# Patient Record
Sex: Male | Born: 1970 | Race: White | Hispanic: No | Smoking: Former smoker
Health system: Southern US, Community
[De-identification: ages and names within clinical notes are randomized; demographics above are authoritative.]

## PROBLEM LIST (undated history)

## (undated) DIAGNOSIS — K219 Gastro-esophageal reflux disease without esophagitis: Secondary | ICD-10-CM

## (undated) DIAGNOSIS — C6292 Malignant neoplasm of left testis, unspecified whether descended or undescended: Secondary | ICD-10-CM

## (undated) DIAGNOSIS — F419 Anxiety disorder, unspecified: Secondary | ICD-10-CM

## (undated) DIAGNOSIS — Z973 Presence of spectacles and contact lenses: Secondary | ICD-10-CM

## (undated) DIAGNOSIS — H9313 Tinnitus, bilateral: Secondary | ICD-10-CM

## (undated) DIAGNOSIS — Z87442 Personal history of urinary calculi: Secondary | ICD-10-CM

## (undated) HISTORY — DX: Malignant neoplasm of left testis, unspecified whether descended or undescended: C62.92

---

## 1999-12-31 ENCOUNTER — Encounter: Payer: Self-pay | Admitting: Emergency Medicine

## 1999-12-31 ENCOUNTER — Emergency Department (HOSPITAL_COMMUNITY): Admission: EM | Admit: 1999-12-31 | Discharge: 2000-01-01 | Payer: Self-pay | Admitting: Emergency Medicine

## 2014-12-06 DIAGNOSIS — Z8547 Personal history of malignant neoplasm of testis: Secondary | ICD-10-CM

## 2014-12-06 DIAGNOSIS — E291 Testicular hypofunction: Secondary | ICD-10-CM

## 2014-12-06 HISTORY — DX: Testicular hypofunction: E29.1

## 2014-12-06 HISTORY — DX: Personal history of malignant neoplasm of testis: Z85.47

## 2014-12-25 ENCOUNTER — Other Ambulatory Visit: Payer: Self-pay | Admitting: Urology

## 2015-01-01 ENCOUNTER — Encounter (HOSPITAL_BASED_OUTPATIENT_CLINIC_OR_DEPARTMENT_OTHER): Payer: Self-pay | Admitting: *Deleted

## 2015-01-01 NOTE — Progress Notes (Signed)
NPO AFTER MN.  ARRIVE AT 0700.  NEEDS HG.  

## 2015-01-02 ENCOUNTER — Encounter (HOSPITAL_BASED_OUTPATIENT_CLINIC_OR_DEPARTMENT_OTHER): Payer: Self-pay | Admitting: Anesthesiology

## 2015-01-02 NOTE — Anesthesia Preprocedure Evaluation (Addendum)
Anesthesia Evaluation  Patient identified by MRN, date of birth, ID band Patient awake    Reviewed: Allergy & Precautions, NPO status , Patient's Chart, lab work & pertinent test results  Airway Mallampati: II  TM Distance: >3 FB Neck ROM: Full    Dental no notable dental hx. (+) Dental Advisory Given,    Pulmonary Current Smoker,  breath sounds clear to auscultation  Pulmonary exam normal       Cardiovascular negative cardio ROS  Rhythm:Regular Rate:Normal     Neuro/Psych Anxiety Tinnitus    GI/Hepatic Neg liver ROS, GERD-  Medicated,  Endo/Other  negative endocrine ROS  Renal/GU negative Renal ROS  negative genitourinary   Musculoskeletal negative musculoskeletal ROS (+)   Abdominal   Peds negative pediatric ROS (+)  Hematology negative hematology ROS (+)   Anesthesia Other Findings   Reproductive/Obstetrics negative OB ROS                            Anesthesia Physical Anesthesia Plan  ASA: II  Anesthesia Plan: General   Post-op Pain Management:    Induction: Intravenous  Airway Management Planned: LMA  Additional Equipment:   Intra-op Plan:   Post-operative Plan: Extubation in OR  Informed Consent: I have reviewed the patients History and Physical, chart, labs and discussed the procedure including the risks, benefits and alternatives for the proposed anesthesia with the patient or authorized representative who has indicated his/her understanding and acceptance.   Dental advisory given  Plan Discussed with: CRNA  Anesthesia Plan Comments:         Anesthesia Quick Evaluation

## 2015-01-03 ENCOUNTER — Ambulatory Visit (HOSPITAL_BASED_OUTPATIENT_CLINIC_OR_DEPARTMENT_OTHER): Payer: BLUE CROSS/BLUE SHIELD | Admitting: Anesthesiology

## 2015-01-03 ENCOUNTER — Encounter (HOSPITAL_BASED_OUTPATIENT_CLINIC_OR_DEPARTMENT_OTHER)
Admission: RE | Disposition: A | Discharge status: Home / Self Care | Payer: Self-pay | Source: Ambulatory Visit | Attending: Urology

## 2015-01-03 ENCOUNTER — Encounter (HOSPITAL_BASED_OUTPATIENT_CLINIC_OR_DEPARTMENT_OTHER): Payer: Self-pay | Admitting: *Deleted

## 2015-01-03 ENCOUNTER — Ambulatory Visit (HOSPITAL_BASED_OUTPATIENT_CLINIC_OR_DEPARTMENT_OTHER)
Admission: RE | Admit: 2015-01-03 | Discharge: 2015-01-03 | Disposition: A | Discharge status: Home / Self Care | Payer: BLUE CROSS/BLUE SHIELD | Source: Ambulatory Visit | Attending: Urology | Admitting: Urology

## 2015-01-03 ENCOUNTER — Ambulatory Visit (HOSPITAL_COMMUNITY): Payer: BLUE CROSS/BLUE SHIELD

## 2015-01-03 DIAGNOSIS — Z888 Allergy status to other drugs, medicaments and biological substances status: Secondary | ICD-10-CM | POA: Diagnosis not present

## 2015-01-03 DIAGNOSIS — F419 Anxiety disorder, unspecified: Secondary | ICD-10-CM | POA: Diagnosis not present

## 2015-01-03 DIAGNOSIS — Z01818 Encounter for other preprocedural examination: Secondary | ICD-10-CM

## 2015-01-03 DIAGNOSIS — N500 Atrophy of testis: Secondary | ICD-10-CM | POA: Insufficient documentation

## 2015-01-03 DIAGNOSIS — L720 Epidermal cyst: Secondary | ICD-10-CM | POA: Diagnosis not present

## 2015-01-03 DIAGNOSIS — I861 Scrotal varices: Secondary | ICD-10-CM | POA: Diagnosis not present

## 2015-01-03 DIAGNOSIS — N508 Other specified disorders of male genital organs: Secondary | ICD-10-CM | POA: Insufficient documentation

## 2015-01-03 DIAGNOSIS — F17210 Nicotine dependence, cigarettes, uncomplicated: Secondary | ICD-10-CM | POA: Diagnosis not present

## 2015-01-03 DIAGNOSIS — N411 Chronic prostatitis: Secondary | ICD-10-CM | POA: Insufficient documentation

## 2015-01-03 DIAGNOSIS — Z882 Allergy status to sulfonamides status: Secondary | ICD-10-CM | POA: Diagnosis not present

## 2015-01-03 DIAGNOSIS — Z881 Allergy status to other antibiotic agents status: Secondary | ICD-10-CM | POA: Insufficient documentation

## 2015-01-03 DIAGNOSIS — C6292 Malignant neoplasm of left testis, unspecified whether descended or undescended: Secondary | ICD-10-CM | POA: Insufficient documentation

## 2015-01-03 HISTORY — DX: Tinnitus, bilateral: H93.13

## 2015-01-03 HISTORY — DX: Anxiety disorder, unspecified: F41.9

## 2015-01-03 HISTORY — PX: ORCHIECTOMY: SHX2116

## 2015-01-03 HISTORY — DX: Presence of spectacles and contact lenses: Z97.3

## 2015-01-03 HISTORY — DX: Gastro-esophageal reflux disease without esophagitis: K21.9

## 2015-01-03 LAB — POCT HEMOGLOBIN-HEMACUE: Hemoglobin: 15.9 g/dL (ref 13.0–17.0)

## 2015-01-03 SURGERY — ORCHIECTOMY
Anesthesia: General | Site: Abdomen | Laterality: Left

## 2015-01-03 MED ORDER — BUPIVACAINE LIPOSOME 1.3 % IJ SUSP
20.0000 mL | Freq: Once | INTRAMUSCULAR | Status: DC
  Filled 2015-01-03: qty 20

## 2015-01-03 MED ORDER — MIDAZOLAM HCL 5 MG/5ML IJ SOLN
INTRAMUSCULAR | Status: DC | PRN
  Administered 2015-01-03: 2 mg via INTRAVENOUS

## 2015-01-03 MED ORDER — FENTANYL CITRATE 0.05 MG/ML IJ SOLN
INTRAMUSCULAR | Status: DC | PRN
  Administered 2015-01-03: 100 ug via INTRAVENOUS

## 2015-01-03 MED ORDER — KETOROLAC TROMETHAMINE 30 MG/ML IJ SOLN
30.0000 mg | Freq: Once | INTRAMUSCULAR | Status: DC
  Filled 2015-01-03: qty 1

## 2015-01-03 MED ORDER — SODIUM CHLORIDE 0.9 % IJ SOLN
INTRAMUSCULAR | Status: AC
  Filled 2015-01-03: qty 10

## 2015-01-03 MED ORDER — ACETAMINOPHEN 10 MG/ML IV SOLN
1000.0000 mg | Freq: Once | INTRAVENOUS | Status: AC
  Administered 2015-01-03: 1000 mg via INTRAVENOUS
  Filled 2015-01-03: qty 100

## 2015-01-03 MED ORDER — BUPIVACAINE LIPOSOME 1.3 % IJ SUSP
INTRAMUSCULAR | Status: DC | PRN
  Administered 2015-01-03: 20 mL

## 2015-01-03 MED ORDER — MIDAZOLAM HCL 2 MG/2ML IJ SOLN
INTRAMUSCULAR | Status: AC
  Filled 2015-01-03: qty 2

## 2015-01-03 MED ORDER — FENTANYL CITRATE 0.05 MG/ML IJ SOLN
25.0000 ug | INTRAMUSCULAR | Status: DC | PRN
  Administered 2015-01-03: 25 ug via INTRAVENOUS
  Filled 2015-01-03: qty 1

## 2015-01-03 MED ORDER — CEFAZOLIN SODIUM-DEXTROSE 2-3 GM-% IV SOLR
2.0000 g | INTRAVENOUS | Status: AC
  Administered 2015-01-03: 2 g via INTRAVENOUS
  Filled 2015-01-03: qty 50

## 2015-01-03 MED ORDER — PROPOFOL 10 MG/ML IV BOLUS
INTRAVENOUS | Status: DC | PRN
  Administered 2015-01-03: 200 mg via INTRAVENOUS

## 2015-01-03 MED ORDER — CEFAZOLIN SODIUM-DEXTROSE 2-3 GM-% IV SOLR
INTRAVENOUS | Status: AC
  Filled 2015-01-03: qty 50

## 2015-01-03 MED ORDER — KETOROLAC TROMETHAMINE 30 MG/ML IJ SOLN
INTRAMUSCULAR | Status: DC | PRN
  Administered 2015-01-03: 30 mg via INTRAVENOUS

## 2015-01-03 MED ORDER — LIDOCAINE HCL (CARDIAC) 20 MG/ML IV SOLN
INTRAVENOUS | Status: DC | PRN
  Administered 2015-01-03: 100 mg via INTRAVENOUS

## 2015-01-03 MED ORDER — DEXAMETHASONE SODIUM PHOSPHATE 4 MG/ML IJ SOLN
INTRAMUSCULAR | Status: DC | PRN
  Administered 2015-01-03: 10 mg via INTRAVENOUS

## 2015-01-03 MED ORDER — PROMETHAZINE HCL 25 MG/ML IJ SOLN
6.2500 mg | INTRAMUSCULAR | Status: DC | PRN
  Filled 2015-01-03: qty 1

## 2015-01-03 MED ORDER — ONDANSETRON HCL 4 MG/2ML IJ SOLN
INTRAMUSCULAR | Status: DC | PRN
  Administered 2015-01-03: 4 mg via INTRAVENOUS

## 2015-01-03 MED ORDER — LACTATED RINGERS IV SOLN
INTRAVENOUS | Status: DC
  Administered 2015-01-03: 08:00:00 via INTRAVENOUS
  Filled 2015-01-03: qty 1000

## 2015-01-03 MED ORDER — OXYCODONE-ACETAMINOPHEN 5-325 MG PO TABS: 1.0000 | ORAL_TABLET | Freq: Four times a day (QID) | ORAL | Status: DC | PRN

## 2015-01-03 MED ORDER — FENTANYL CITRATE 0.05 MG/ML IJ SOLN
INTRAMUSCULAR | Status: AC
  Filled 2015-01-03: qty 2

## 2015-01-03 MED ORDER — FENTANYL CITRATE 0.05 MG/ML IJ SOLN
INTRAMUSCULAR | Status: AC
  Filled 2015-01-03: qty 4

## 2015-01-03 SURGICAL SUPPLY — 51 items
BLADE CLIPPER SURG (BLADE) ×3 IMPLANT
BLADE SURG 15 STRL LF DISP TIS (BLADE) ×1 IMPLANT
BLADE SURG 15 STRL SS (BLADE) ×2
BNDG GAUZE ELAST 4 BULKY (GAUZE/BANDAGES/DRESSINGS) ×3 IMPLANT
BRIEF STRETCH FOR OB PAD LRG (UNDERPADS AND DIAPERS) ×3 IMPLANT
CLEANER CAUTERY TIP 5X5 PAD (MISCELLANEOUS) ×1 IMPLANT
CLOTH BEACON ORANGE TIMEOUT ST (SAFETY) ×3 IMPLANT
COVER MAYO STAND STRL (DRAPES) ×3 IMPLANT
COVER TABLE BACK 60X90 (DRAPES) ×3 IMPLANT
DERMABOND ADVANCED (GAUZE/BANDAGES/DRESSINGS) ×2
DERMABOND ADVANCED .7 DNX12 (GAUZE/BANDAGES/DRESSINGS) ×1 IMPLANT
DRAIN PENROSE 18X1/4 LTX STRL (WOUND CARE) IMPLANT
DRAPE PED LAPAROTOMY (DRAPES) ×3 IMPLANT
ELECT NEEDLE TIP 2.8 STRL (NEEDLE) ×3 IMPLANT
ELECT REM PT RETURN 9FT ADLT (ELECTROSURGICAL) ×3
ELECTRODE REM PT RTRN 9FT ADLT (ELECTROSURGICAL) ×1 IMPLANT
GLOVE BIO SURGEON STRL SZ 6.5 (GLOVE) ×2 IMPLANT
GLOVE BIO SURGEON STRL SZ7.5 (GLOVE) ×3 IMPLANT
GLOVE BIO SURGEONS STRL SZ 6.5 (GLOVE) ×1
GLOVE BIOGEL PI IND STRL 6.5 (GLOVE) ×1 IMPLANT
GLOVE BIOGEL PI IND STRL 7.0 (GLOVE) ×1 IMPLANT
GLOVE BIOGEL PI IND STRL 7.5 (GLOVE) ×1 IMPLANT
GLOVE BIOGEL PI INDICATOR 6.5 (GLOVE) ×2
GLOVE BIOGEL PI INDICATOR 7.0 (GLOVE) ×2
GLOVE BIOGEL PI INDICATOR 7.5 (GLOVE) ×2
GLOVE SKINSENSE NS SZ6.5 (GLOVE) ×4
GLOVE SKINSENSE STRL SZ6.5 (GLOVE) ×2 IMPLANT
GOWN PREVENTION PLUS LG XLONG (DISPOSABLE) IMPLANT
GOWN STRL REIN XL XLG (GOWN DISPOSABLE) IMPLANT
GOWN STRL REUS W/TWL LRG LVL3 (GOWN DISPOSABLE) ×6 IMPLANT
GOWN STRL REUS W/TWL XL LVL3 (GOWN DISPOSABLE) ×3 IMPLANT
NDL SAFETY ECLIPSE 18X1.5 (NEEDLE) IMPLANT
NEEDLE HYPO 18GX1.5 SHARP (NEEDLE)
NEEDLE HYPO 22GX1.5 SAFETY (NEEDLE) ×3 IMPLANT
NS IRRIG 500ML POUR BTL (IV SOLUTION) ×3 IMPLANT
PACK BASIN DAY SURGERY FS (CUSTOM PROCEDURE TRAY) ×3 IMPLANT
PAD CLEANER CAUTERY TIP 5X5 (MISCELLANEOUS) ×2
PENCIL BUTTON HOLSTER BLD 10FT (ELECTRODE) ×3 IMPLANT
SUT MNCRL AB 4-0 PS2 18 (SUTURE) ×3 IMPLANT
SUT PROLENE 1 CT 1 30 (SUTURE) ×3 IMPLANT
SUT VIC AB 0 CT1 36 (SUTURE) ×3 IMPLANT
SUT VIC AB 3-0 CT1 36 (SUTURE) ×3 IMPLANT
SUT VIC AB 3-0 SH 27 (SUTURE) ×2
SUT VIC AB 3-0 SH 27X BRD (SUTURE) ×1 IMPLANT
SUT VIC AB 3-0 X1 27 (SUTURE) ×3 IMPLANT
SUT VICRYL 0 TIES 12 18 (SUTURE) ×3 IMPLANT
SUT VICRYL 3 0 BR 18  UND (SUTURE) ×2
SUT VICRYL 3 0 BR 18 UND (SUTURE) ×1 IMPLANT
SYR CONTROL 10ML LL (SYRINGE) ×3 IMPLANT
TRAY DSU PREP LF (CUSTOM PROCEDURE TRAY) ×3 IMPLANT
WATER STERILE IRR 500ML POUR (IV SOLUTION) IMPLANT

## 2015-01-03 NOTE — Anesthesia Postprocedure Evaluation (Signed)
  Anesthesia Post-op Note  Patient: Manuel Gomez  Procedure(s) Performed: Procedure(s) (LRB): LEFT RADICAL INGUINAL ORCHIECTOMY (Left)  Patient Location: PACU  Anesthesia Type: General  Level of Consciousness: awake and alert   Airway and Oxygen Therapy: Patient Spontanous Breathing  Post-op Pain: mild  Post-op Assessment: Post-op Vital signs reviewed, Patient's Cardiovascular Status Stable, Respiratory Function Stable, Patent Airway and No signs of Nausea or vomiting  Last Vitals:  Filed Vitals:   01/03/15 1100  BP: 108/69  Pulse: 67  Temp:   Resp: 11    Post-op Vital Signs: stable   Complications: No apparent anesthesia complications

## 2015-01-03 NOTE — Anesthesia Procedure Notes (Signed)
Procedure Name: LMA Insertion Date/Time: 01/03/2015 9:02 AM Performed by: Bethena Roys T Pre-anesthesia Checklist: Patient identified, Emergency Drugs available, Suction available and Patient being monitored Patient Re-evaluated:Patient Re-evaluated prior to inductionOxygen Delivery Method: Circle System Utilized Preoxygenation: Pre-oxygenation with 100% oxygen Intubation Type: IV induction Ventilation: Mask ventilation without difficulty LMA: LMA inserted LMA Size: 5.0 Number of attempts: 1 Airway Equipment and Method: Bite block Placement Confirmation: positive ETCO2 Dental Injury: Teeth and Oropharynx as per pre-operative assessment

## 2015-01-03 NOTE — H&P (Signed)
Reason For Visit 1 week f/u   Active Problems Problems  1. 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 Prostatitis, chronic (N41.1)  History of Present Illness     44 yo male returns today to review scrotal u/s & lab results. Hx of prostate swelling & Lt testicle is hard.     12/11/14 Scrotal u/s: The right testicle measures 3.04 x 1.24 x 1.65 cm. The left testicle measures 3.40 x 1.10 x 2.26 m. On the left side, there is an inhomogeneous area with blood flow within, located within the left testicle, measuring 1.3 x 1.17 x 1.28 cm. There is a left epidermal cyst, measuring 0.2 cm. There is a left varicocele noted as well. There is internal blood flow bilaterally.   Past Medical History Problems  1. History of Anxiety (F41.9)  Surgical History Problems  1. History of No Surgical Problems  Current Meds 1. No Reported Medications Recorded  Allergies Medication  1. Bactrim TABS 2. Ciprofloxacin HCl TABS  Family History Problems  1. No pertinent family history  Social History Problems  1. Alcohol use (F10.99) 2. Caffeine use (F15.90)   4 per day 3. Current every day smoker (F17.200) 4. Divorced 5. Number of children   1 daughter 6. Occupation   Counsellor 7. Smokes cigarettes (F17.210)   1 ppd  Review of Systems Genitourinary, constitutional, skin, eye, otolaryngeal, hematologic/lymphatic, cardiovascular, pulmonary, endocrine, musculoskeletal, gastrointestinal, neurological and psychiatric system(s) were reviewed and pertinent findings if present are noted and are otherwise negative.  Genitourinary: incontinence, difficulty starting the urinary stream and testicular mass.  Gastrointestinal: heartburn.  Psychiatric: anxiety.    Vitals Vital Signs [Data Includes: Last 1 Day]  Recorded: [id number] 01:32PM  Blood Pressure: 125 / 86 Temperature: 98.2 F Heart Rate: 68  Physical Exam Constitutional: Well nourished and well developed . No acute  distress.  ENT:. The ears and nose are normal in appearance.  Neck: The appearance of the neck is normal and no neck mass is present.  Pulmonary: No respiratory distress and normal respiratory rhythm and effort.  Cardiovascular: Heart rate and rhythm are normal . No peripheral edema.  Abdomen: The abdomen is soft and nontender. No masses are palpated. No CVA tenderness. No hernias are palpable. No hepatosplenomegaly noted.  Genitourinary: Examination of the penis demonstrates no swelling, no tenderness, no discharge, no masses and a normal meatus. The penis is circumcised. The scrotum is normal in appearance. The right vas deferens is is palpably normal. The left vas deferens is palpably normal. The right testis is atrophic. The left testis is atrophic and found to have a 3 cm mass.  Lymphatics: The femoral and inguinal nodes are not enlarged or tender.  Skin: Normal skin turgor, no visible rash and no visible skin lesions.  Neuro/Psych:. Mood and affect are appropriate.    Results/Data Selected Results  TESTOSTERONE [id number] 03:16PM Carolan Clines  SPECIMEN TYPE: BLOOD   Test Name Result Flag Reference  TESTOSTERONE, TOTAL 410 ng/dL  300-890  TANNER STAGE       MALE              MALE               I              < 30 NG/DL        < 10 NG/DL               II             <  150 NG/DL       < 30 NG/DL               III            100-320 NG/DL     < 35 NG/DL               IV             200-970 NG/DL     15-40 NG/DL               V/ADULT        300-890 NG/DL     10-70 NG/DL   PROLACTIN [id number] 03:16PM Carolan Clines  SPECIMEN TYPE: BLOOD   Test Name Result Flag Reference  PROLACTIN 7.6 ng/mL  2.1-17.1  REFERENCE RANGES:                  MALE:                       2.1 -  17.1 NG/ML                  MALE:   PREGNANT          9.7 - 208.5 NG/ML                            NON PREGNANT      2.8 -  29.2 NG/ML                            POST MENOPAUSAL   1.8 -  20.3 NG/ML    ALPHA-FETOPROTIEN (TUMOR MARKER) [id number] 03:15PM Carolan Clines  SPECIMEN TYPE: BLOOD   Test Name Result Flag Reference  AFP Tumor Marker 2.7 ng/mL  <6.1  PATIENTS < 45 MONTH OLD: * PEDIATRIC RANGE IS BASED ON FULL TERM NEONATES, VALUES FOR PREMATURE INFANTS MAY BE HIGHER.   MALE: THE USE OF AFP AS A TUMOR MARKER IN PREGNANT PATIENTS IS NOT RECOMMENDED.   THIS TEST WAS PERFORMED USING THE BECKMAN COULTER CHEMILUMINESCENT METHOD. VALUES OBTAINED FROM DIFFERENT ASSAY METHODS CANNOT BE USED INTERCHANGEABLY. AFP LEVELS, REGARDLESS OF VALUE, SHOULD NOT BE INTERPRETED AS ABSOLUTE EVIDENCE OF THE PRESENCE OR ABSENCE OF DISEASE.   BETA HCG TUMOR MARKER [id number] 03:15PM Carolan Clines  SPECIMEN TYPE: BLOOD   Test Name Result Flag Reference  BETA HCG, TUMOR MARKER < 2.0 mIU/mL  < 5.0  REFERENCE RANGE:       MALES: <5 MIU/ML       FEMALES: NONPREGNANT: <5 MIU/ML     VALUES FROM DIFFERENT ASSAY METHODS MAY VARY. THE USE OF THIS  ASSAY TO MONITOR OR TO DIAGNOSE PATIENTS WITH CANCER OR ANY  CONDITION OTHER THAN PREGNANCY HAS NOT BEEN APPROVED BY THE FDA  OF THE MANUFACTURER OF THE ASSAY.     THIS HCG ASSAY IS PERFORMED ON THE BECKMAN COULTER IMMUNOASSAY  PLATFORM. USE OF AN ALTERNATIVE HCG ASSAY PROVIDES CORROBORATION  POTENTIALLY USEFUL IN DISTINGUISING TRUE VERSUS FALSE ELEVATIONS  OF HCG. TO FURTHER RULE OUT FALSE ELEVATIONS OF HCG, ALSO  CONSIDER MEASUREMENT OF HCG AFTER PRETREATMENT FOR HUMAN  ANTI-ANIMAL [MOUSE] ANTIBODY (HCG, TOTAL, WITH HAMA TREATMENT),  ORDER CODE [id number].   [facility] [place] [id number] 03:15PM Carolan Clines  SPECIMEN TYPE: BLOOD   Test Name Result Flag Reference  FSH 56.7 mIU/mL H 1.4-18.1  REFERENCE RANGES:  MALE:                         1.4 -  18.1 MIU/ML          MALE:   FOLLICULAR PHASE    2.5 -  10.2 MIU/ML                    MIDCYCLE PEAK       3.4 -  33.4 MIU/ML                    LUTEAL PHASE        1.5 -   9.1 MIU/ML                     POST MENOPAUSAL    23.0 - 116.3 MIU/ML                    PREGNANT                <   0.3 MIU/ML  LH 16.1 mIU/mL H 1.5-9.3  REFERENCE RANGES:          MALE:     20 - 70 YEARS           1.5 -  9.3 MIU/ML                       > 70 YEARS           3.1 - 34.6 MIU/ML          MALE:   FOLLICULAR PHASE        1.9 - 12.5 MIU/ML                    MIDCYCLE                8.7 - 76.3 MIU/ML                    LUTEAL PHASE            0.5 - 16.9 MIU/ML                    POST MENOPAUSAL        15.9 - 54.0 MIU/ML                    PREGNANT                    <  1.5 MIU/ML                    CONTRACEPTIVES          0.7 -  5.6 MIU/ML          CHILDREN:                             <  6.0 MIU/ML   Assessment Assessed  1. 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 Non-tender. He could have Ca testis, or hormone-producing tumor of the testis. He needs orchiectomy, via L inguinal route, and then pathology, and f/u of his hormones ( [place], LH). ? Leydig, Granulosa Cell, or Sertoli cell tumor; or paratestis tumor. Also, ? possibility of pituitary tumor-if his [place] and LH do not normalize post orchiectomy.  Plan Health Maintenance  1. UA With REFLEX; [Do Not Release]; Status:In Progress - Specimen/Data  Collected;   Done: [id number]  Plan for L radical orchiectomy.   Signatures Electronically signed by : Carolan Clines, M.D.; Dec 18 2014  1:56PM EST

## 2015-01-03 NOTE — Transfer of Care (Signed)
Immediate Anesthesia Transfer of Care Note  Patient: Manuel Gomez  Procedure(s) Performed: Procedure(s): LEFT RADICAL INGUINAL ORCHIECTOMY (Left)  Patient Location: PACU  Anesthesia Type:General  Level of Consciousness: awake, alert  and oriented  Airway & Oxygen Therapy: Patient Spontanous Breathing and Patient connected to nasal cannula oxygen  Post-op Assessment: Report given to RN  Post vital signs: Reviewed and stable  Last Vitals:  Filed Vitals:   01/03/15 1014  BP:   Pulse:   Temp: 36.5 C  Resp:     Complications: No apparent anesthesia complications

## 2015-01-03 NOTE — Op Note (Signed)
Pre-operative diagnosis :   Atrophic left testicle with intratesticular mass  Postoperative diagnosis:  Same  Operation:  Left radical  inguinal orchiectomy  Surgeon:  S. Gaynelle Arabian, MD  First assistant: None    Anesthesia:  General  LMA   Preparation:  After appropriate preanesthesia, the patient was brought the operating room, placed on the operating table in dorsal supine position where general LMA anesthesia was introduced. The left inguinal canal was shaven, and the scrotum and inguinal canal and penis were prepped with Betadine solution, and draped in usual fashion. The history was reviewed. The armband was double checked. The patient received IV antibiotic, and we will receive IV Tylenol and IV Toradol during the procedure. He will receive a spermatic cord block with Expareil, as well as wound local anesthetic injection.   Review history:  Active Problems Problems  1. 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 Prostatitis, chronic (N41.1)  History of Present Illness    44 yo male returns today to review scrotal u/s & lab results. Hx of prostate swelling & Lt testicle is hard.     12/11/14 Scrotal u/s: The right testicle measures 3.04 x 1.24 x 1.65 cm. The left testicle measures 3.40 x 1.10 x 2.26 m. On the left side, there is an inhomogeneous area with blood flow within, located within the left testicle, measuring 1.3 x 1.17 x 1.28 cm. There is a left epidermal cyst, measuring 0.2 cm. There is a left varicocele noted as well. There is internal blood flow bilaterally.   Past Medical History Problems  1. History of Anxiety (F41.9)  Statement of  Likelihood of Success: Excellent. TIME-OUT observed.:  Procedure:  The pubic tubercle was outlined with a blue marking pen. The iliac crest was outlined with the marking pen. Area of incision was then outlined with blue marking pen. Using a 15 blade, a 5 cm lower inguinal incision was made and subcutaneous to his  tissue was dissected with electrosurgical unit. The fascia of the transversalis muscle was identified. Using a 15 blade, the transverses abdominis fascia was entered, and the spermatic cord was identified. A right angle clamp was used to dissected the spermatic cord at the level of pubic tubercle. The testicle was delivered into the wound, and no bleeding was noted. A Kelly clamp was placed across the spermatic cord, and the spermatic cord was dissected, so that the vas was isolated first, doubly clamped, and incised with electrosurgical unit. 3-0 Vicryl tie was placed on either end. The cord was then dissected, and doubly clamped in 2 places. The cord was then amputated. The vasculature was ligated with 0 Vicryl suture. The base of the cord was then suture ligated with #1 nylon suture. This was cut with a long tail. The remaining spermatic cord, as well as the wound edges were injected with long-acting local anesthetic.  The wound was irrigated with saline, and closed in the following fashion:  Closure: The fascia was closed with running 3-0 Vicryl suture, with care taken to avoid into the ilioinguinal nerve. The subcutaneous tissue was closed with running 3-0 Vicryl suture, and the skin was closed with 4-0 Vicryl running subcuticular closure. The scope skin was also treated with Dermabond,  and mesh pants with Kerlix was placed. The patient tolerated procedure well. He was awakened and taken to recovery room in good condition.

## 2015-01-03 NOTE — Discharge Instructions (Addendum)
HOME CARE INSTRUCTIONS FOR [facility]CROTAL PROCEDURES  Wound Care & Hygiene: You may apply an ice bag to the scrotum for the first 24 hours.  This may help decrease swelling and soreness.  You may have a dressing held in place by an athletic supporter/ mesh briefs  You may the shower in 24 hours.  Continue to use   briefs for at least a week.  Activity: Rest today - not necessarily flat bed rest.  Just take it easy.  You should not do strenuous activities until your follow-up visit with your doctor.  You may resume light activity in 48 hours.  Return to Work:  Your doctor will advise you of this depending on the type of work you do  Diet: Drink liquids or eat a light diet this evening.  You may resume a regular diet tomorrow.  General Expectations: You may have a small amount of bleeding.  The scrotum may be swollen or bruised for about a week.  Call your Doctor if these occur:  -persistent or heavy bleeding  -temperature of 101 degrees or more  -severe pain, not relieved by your pain medication  Return to [place]:  Per appointment Call to set up and appointment.  Patient Signature:  __________________________________________________  Nurse's Signature:  __________________________________________________  Post Anesthesia Home Care Instructions  Activity: Get plenty of rest for the remainder of the day. A responsible adult should stay with you for 24 hours following the procedure.  For the next 24 hours, DO NOT: -Drive a car -Paediatric nurse -Drink alcoholic beverages -Take any medication unless instructed by your physician -Make any legal decisions or sign important papers.  Meals: Start with liquid foods such as gelatin or soup. Progress to regular foods as tolerated. Avoid greasy, spicy, heavy foods. If nausea and/or vomiting occur, drink only clear liquids until the nausea and/or vomiting subsides. Call your physician if vomiting continues.  Special  Instructions/Symptoms: Your throat may feel dry or sore from the anesthesia or the breathing tube placed in your throat during surgery. If this causes discomfort, gargle with warm salt water. The discomfort should disappear within 24 hours. Information for Discharge Teaching: EXPAREL (bupivacaine liposome injectable suspension)   Your surgeon gave you EXPAREL(bupivacaine) in your surgical incision to help control your pain after surgery.   EXPAREL is a local anesthetic that provides pain relief by numbing the tissue around the surgical site.  EXPAREL is designed to release pain medication over time and can control pain for up to 72 hours.  Depending on how you respond to EXPAREL, you may require less pain medication during your recovery.  Possible side effects:  Temporary loss of sensation or ability to move in the area where bupivacaine was injected.  Nausea, vomiting, constipation  Rarely, numbness and tingling in your mouth or lips, lightheadedness, or anxiety may occur.  Call your doctor right away if you think you may be experiencing any of these sensations, or if you have other questions regarding possible side effects.  Follow all other discharge instructions given to you by your surgeon or nurse. Eat a healthy diet and drink plenty of water or other fluids.  If you return to the hospital for any reason within 96 hours following the administration of EXPAREL, please inform your health care providers.

## 2015-01-03 NOTE — Interval H&P Note (Signed)
History and Physical Interval Note:  01/03/2015 8:53 AM  Manuel Gomez  has presented today for surgery, with the diagnosis of LEFT TESTICULAR MASS  The various methods of treatment have been discussed with the patient and family. After consideration of risks, benefits and other options for treatment, the patient has consented to  Procedure(s): LEFT RADICAL INGUINAL ORCHIECTOMY (Left) as a surgical intervention .  The patient's history has been reviewed, patient examined, no change in status, stable for surgery.  I have reviewed the patient's chart and labs.  Questions were answered to the patient's satisfaction.    Goals, Liklihood of Success, Alternate Rs, disability discussed with patient,  mother and girlfriend. Will await pathology, and repeat hormone levels next week. May need pituitary MRI in future. May need endocrinology evaluation in future.   Carolan Clines I

## 2015-01-06 ENCOUNTER — Encounter (HOSPITAL_BASED_OUTPATIENT_CLINIC_OR_DEPARTMENT_OTHER): Payer: Self-pay | Admitting: Urology

## 2015-02-14 ENCOUNTER — Encounter: Payer: Self-pay | Admitting: Radiation Oncology

## 2015-02-14 NOTE — Progress Notes (Signed)
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    Past/Anticipated interventions by urology, if any: left radical inguinal orchiectomy done 01/03/2015  Past/Anticipated interventions by medical oncology, if any: no  Weight changes, if any: no  Bowel/Bladder complaints, if any: no   Nausea/Vomiting, if any: no  Pain issues, if any:  no  SAFETY ISSUES:  Prior radiation? no  Pacemaker/ICD? no  Possible current pregnancy? no  Is the patient on methotrexate? no  Current Complaints / other details: 44 year old male. LM:[id number] and Cipro. 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

## 2015-02-17 ENCOUNTER — Ambulatory Visit
Admission: RE | Admit: 2015-02-17 | Discharge: 2015-02-17 | Disposition: A | Discharge status: Home / Self Care | Payer: BLUE CROSS/BLUE SHIELD | Source: Ambulatory Visit | Attending: Radiation Oncology | Admitting: Radiation Oncology

## 2015-02-17 ENCOUNTER — Encounter: Payer: Self-pay | Admitting: Radiation Oncology

## 2015-02-17 VITALS — BP 119/81 | HR 68 | Temp 98.0°F | Resp 16 | Ht 72.0 in | Wt 176.8 lb

## 2015-02-17 DIAGNOSIS — K219 Gastro-esophageal reflux disease without esophagitis: Secondary | ICD-10-CM | POA: Diagnosis not present

## 2015-02-17 DIAGNOSIS — F17210 Nicotine dependence, cigarettes, uncomplicated: Secondary | ICD-10-CM | POA: Diagnosis not present

## 2015-02-17 DIAGNOSIS — C6212 Malignant neoplasm of descended left testis: Secondary | ICD-10-CM | POA: Diagnosis not present

## 2015-02-17 DIAGNOSIS — Z9079 Acquired absence of other genital organ(s): Secondary | ICD-10-CM | POA: Insufficient documentation

## 2015-02-17 DIAGNOSIS — N5089 Other specified disorders of the male genital organs: Secondary | ICD-10-CM

## 2015-02-17 NOTE — Progress Notes (Signed)
See progress note under physician encounter. 

## 2015-02-17 NOTE — Progress Notes (Signed)
Patient without urinary/bladder complaints. Denies weight loss or night sweats. Denies fatigue. Denies pain. Vitals stable.

## 2015-02-17 NOTE — Progress Notes (Signed)
Radiation Oncology         [phone]) [phone] ________________________________  Initial outpatient Consultation  Name: Manuel Gomez MRN: [id number]  Date: 02/17/2015  DOB: 1971/07/14  CC:No primary care provider on file.  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 Seminoma of descended left testis 186.9 C62.12     HISTORY OF PRESENT ILLNESS::Manuel Gomez is a 44 y.o. male who initially presented with left testicular swelling and firmness. He underwent scrotal ultrasound on 12/11/2014. The left testicle was larger than right and contained an inhomogeneous area with blood flow measuring 1.3 cm.  On physical exam, the testes were described as atrophic with a 3 cm mass in the left testicle. The patient proceeded to left radical inguinal orchiectomy on 01/03/2015.      He has recovered uneventfully from this procedure. His case was presented in our multidisciplinary genitourinary tumor board this past week. We reviewed his pathology findings and radiology findings at that time. He has been referred today for discussion of potential radiation treatment options.  PREVIOUS RADIATION THERAPY: No  PAST MEDICAL HISTORY:  has a past medical history of GERD (gastroesophageal reflux disease); Wears glasses; Tinnitus of both ears; Anxiety; and Seminoma of left testis.    PAST SURGICAL HISTORY: Past Surgical History  Procedure Laterality Date  . Orchiectomy Left 01/03/2015    Procedure: LEFT RADICAL INGUINAL ORCHIECTOMY;  Surgeon: Manuel Rud, MD;  Location: [facility];  Service: Urology;  Laterality: Left;    FAMILY HISTORY: family history is negative for Cancer.  SOCIAL HISTORY:  reports that he has been smoking Cigarettes.  He has a 5 pack-year smoking history. He has never used smokeless tobacco. He reports that he drinks alcohol. He reports that he  does not use illicit drugs.  ALLERGIES: Tetracyclines & related; Bactrim; and Ciprofloxacin  MEDICATIONS:  Current Outpatient Prescriptions  Medication Sig Dispense Refill  . calcium carbonate (TUMS - DOSED IN MG ELEMENTAL CALCIUM) 500 MG chewable tablet Chew 1 tablet by mouth as needed for indigestion or heartburn.    Marland Kitchen oxyCODONE-acetaminophen (ROXICET) 5-325 MG per tablet Take 1 tablet by mouth every 6 (six) hours as needed for moderate pain or severe pain. Take 1/2 to 1 tablet q 4-6 hrs as needed for pain. May cause constipation May cause nausea 30 tablet 0   No current facility-administered medications for this encounter.    REVIEW OF SYSTEMS:  A 15 point review of systems is documented in the electronic medical record. This was obtained by the nursing staff. However, I reviewed this with the patient to discuss relevant findings and make appropriate changes.  A comprehensive review of systems was negative.   PHYSICAL EXAM:  vitals were not taken for this visit.  The patient's neck and supraclavicular fossae are free of adenopathy. Bilateral axillae are free of adenopathy. Lungs are clear heart is regular abdomen is soft and nontender with normal active bowel sounds. The left inguinal orchiectomy's incision is well attended with some erythema and scabbing but no evidence of infection..  KPS = 90  100 - Normal; no complaints; no evidence of disease. 90   - Able to carry on normal activity; minor signs or symptoms of disease. 80   - Normal activity with effort; some signs or symptoms of disease. 71   - Cares for self; unable to carry on normal activity or to do active work. 60   -  Requires occasional assistance, but is able to care for most of his personal needs. 50   - Requires considerable assistance and frequent medical care. 72   - Disabled; requires special care and assistance. 58   - Severely disabled; hospital admission is indicated although death not imminent. 41   - Very sick;  hospital admission necessary; active supportive treatment necessary. 10   - Moribund; fatal processes progressing rapidly. 0     - Dead  Karnofsky DA, Abelmann WH, Craver LS and Burchenal [facility] [phone]) The use of the nitrogen mustards in the palliative treatment of carcinoma: with particular reference to bronchogenic carcinoma Cancer 1 [phone]     IMPRESSION: This gentleman is a very nice 44 year old man with stage I left testicular seminoma. He is eligible for a variety of options including active surveillance, para-aortic radiotherapy, or carboplatin-based chemotherapy. In the setting of compliant patients, active surveillance is felt to represent the preferred treatment option. From the patient's medical record and history, it seems that he has demonstrated compliance with previous recommendations and may be a good candidate for surveillance.  PLAN:Today, I talked to the patient and family about the findings and work-up thus far.  We discussed the natural history of stage I seminoma and general treatment, highlighting the role of radiotherapy in the management.  We discussed the available radiation techniques, and focused on the details of logistics and delivery.  We reviewed the anticipated acute and late sequelae associated with radiation in this setting.  The patient was encouraged to ask questions that I answered to the best of my ability.   The patient would like to proceed with medical oncology consultation, but he is leaning towards surveillance.  I have asked that he return for follow-up in 6 months in hopes of developing a rotating follow-up schedule. After reviewing the appropriate [place] and guidelines which were updated on 01/30/2015, I copied and paste of the relevant algorithm and suggested follow-up schedule below.  I spent 45 minutes minutes face to face with the patient and more than 50% of that time was spent in counseling and/or coordination of care.       ------------------------------------------------  Manuel Gomez. Tammi Klippel, M.D.

## 2015-02-18 ENCOUNTER — Telehealth: Payer: Self-pay | Admitting: Oncology

## 2015-02-18 NOTE — Telephone Encounter (Signed)
Confirmed appt with patient for 03/26/15 @ 10:30am with Dr Alen Blew.  Dx: Seminoma of descended left testis Referring:  Dr. Tammi Klippel

## 2015-03-26 ENCOUNTER — Other Ambulatory Visit: Payer: BLUE CROSS/BLUE SHIELD

## 2015-03-26 ENCOUNTER — Ambulatory Visit: Payer: BLUE CROSS/BLUE SHIELD

## 2015-03-26 ENCOUNTER — Ambulatory Visit: Payer: BLUE CROSS/BLUE SHIELD | Admitting: Oncology

## 2015-08-07 ENCOUNTER — Ambulatory Visit: Payer: Self-pay | Admitting: Radiation Oncology

## 2015-08-07 ENCOUNTER — Ambulatory Visit: Payer: BLUE CROSS/BLUE SHIELD

## 2015-08-14 ENCOUNTER — Ambulatory Visit: Payer: BLUE CROSS/BLUE SHIELD | Attending: Radiation Oncology

## 2015-08-14 ENCOUNTER — Telehealth: Payer: Self-pay | Admitting: Radiation Oncology

## 2015-08-14 ENCOUNTER — Ambulatory Visit
Admission: RE | Admit: 2015-08-14 | Payer: BLUE CROSS/BLUE SHIELD | Source: Ambulatory Visit | Admitting: Radiation Oncology

## 2015-08-14 NOTE — Telephone Encounter (Signed)
Patient a no show for appointment today. Phoned to reschedule and inquire of status. No answer. Left message requesting return call.

## 2015-08-21 ENCOUNTER — Encounter: Payer: Self-pay | Admitting: *Deleted

## 2015-08-21 ENCOUNTER — Telehealth: Payer: Self-pay | Admitting: *Deleted

## 2015-08-21 NOTE — Telephone Encounter (Signed)
Patient was a no show for 08/14/15 appointment with Dr. Tammi Klippel.  Sent no show letter

## 2018-01-28 ENCOUNTER — Encounter (HOSPITAL_COMMUNITY): Payer: Self-pay | Admitting: Nurse Practitioner

## 2018-01-28 ENCOUNTER — Emergency Department (HOSPITAL_COMMUNITY)
Admission: EM | Admit: 2018-01-28 | Discharge: 2018-01-29 | Disposition: A | Discharge status: Home / Self Care | Payer: BLUE CROSS/BLUE SHIELD | Attending: Emergency Medicine | Admitting: Emergency Medicine

## 2018-01-28 DIAGNOSIS — F17210 Nicotine dependence, cigarettes, uncomplicated: Secondary | ICD-10-CM | POA: Insufficient documentation

## 2018-01-28 DIAGNOSIS — F410 Panic disorder [episodic paroxysmal anxiety] without agoraphobia: Secondary | ICD-10-CM

## 2018-01-28 DIAGNOSIS — Z720 Tobacco use: Secondary | ICD-10-CM

## 2018-01-28 LAB — BASIC METABOLIC PANEL
ANION GAP: 11 (ref 5–15)
BUN: 17 mg/dL (ref 6–20)
CHLORIDE: 107 mmol/L (ref 101–111)
CO2: 23 mmol/L (ref 22–32)
Calcium: 9.4 mg/dL (ref 8.9–10.3)
Creatinine, Ser: 1.02 mg/dL (ref 0.61–1.24)
GFR calc Af Amer: >60 mL/min (ref >60)
GFR calc non Af Amer: >60 mL/min (ref >60)
GLUCOSE: 117 mg/dL — AB (ref 65–99)
Potassium: 3.4 mmol/L — ABNORMAL LOW (ref 3.5–5.1)
Sodium: 141 mmol/L (ref 135–145)

## 2018-01-28 LAB — I-STAT TROPONIN, ED: TROPONIN I, POC: 0 ng/mL (ref 0.00–0.08)

## 2018-01-28 LAB — CBC
HCT: 47.4 % (ref 39.0–52.0)
HEMOGLOBIN: 16.6 g/dL (ref 13.0–17.0)
MCH: 31.9 pg (ref 26.0–34.0)
MCHC: 35 g/dL (ref 30.0–36.0)
MCV: 91.2 fL (ref 78.0–100.0)
Platelets: 248 10*3/uL (ref 150–400)
RBC: 5.2 MIL/uL (ref 4.22–5.81)
RDW: 12.9 % (ref 11.5–15.5)
WBC: 9.1 10*3/uL (ref 4.0–10.5)

## 2018-01-28 MED ORDER — ASPIRIN 81 MG PO CHEW
324.0000 mg | CHEWABLE_TABLET | Freq: Once | ORAL | Status: DC
  Filled 2018-01-28: qty 4

## 2018-01-28 NOTE — ED Triage Notes (Addendum)
Pt states about 30 minutes ago, he started experiencing chest pain and shortness of breath. Adding that he has a hx of panic attacks but has not had one for a long time. His chest pain persists at 4/10. One pack a day cigarette smoker, denies any other significant hx.

## 2018-01-28 NOTE — ED Provider Notes (Signed)
[place] DEPT Provider Note   CSN: [id number] Arrival date & time: 01/28/18  2120     History   Chief Complaint Chief Complaint  Patient presents with  . Chest Pain    HPI Manuel Gomez is a 47 y.o. male who presents the emergency department with chief complaint of chest pain.  He has a history of previous anxiety attacks.  His girlfriend was driving.  She states that she looked over and he suddenly laid the car back, began sweating and breathing heavily and rolling down the window.  The patient states that he felt like he could not breathe and felt like if he did not get out of the car immediately he was going to die.  He states it felt like previous panic attacks however he has not had a very long time.  He does not have any inciting events that may have triggered this that he can think of.  He has not been under significant stress.  Risk factors for ACS include smoking and male sex.  He denies a history of hypertension, hypercholesterolemia, family history of coronary artery disease or MI.  He does have a previous history of testicular seminoma.  He denies pleuritic chest pain hemoptysis, shortness of breath.  He states that his symptoms have mostly resolved at this time although he does feel a slight bit of chest tightness.  He has had a previous cardiac stress test about 10 years ago that was negative.  HPI  Past Medical History:  Diagnosis Date  . Anxiety   . GERD (gastroesophageal reflux disease)   . Seminoma of left testis ([place])   . Tinnitus of both ears   . Wears glasses     Patient Active Problem List   Diagnosis Date Noted  . Seminoma of descended left testis ([place]) 02/17/2015    Past Surgical History:  Procedure Laterality Date  . ORCHIECTOMY Left 01/03/2015   Procedure: LEFT RADICAL INGUINAL ORCHIECTOMY;  Surgeon: Ailene Rud, MD;  Location[facility];  Service: Urology;  Laterality: Left;       Home  Medications    Prior to Admission medications   Medication Sig Start Date End Date Taking? Authorizing Provider  calcium carbonate (TUMS - DOSED IN MG ELEMENTAL CALCIUM) 500 MG chewable tablet Chew 1 tablet by mouth as needed for indigestion or heartburn.    [provider]    Family History Family History  Problem Relation Age of Onset  . Cancer Neg Hx     Social History Social History   Tobacco Use  . Smoking status: Current Every Day Smoker    Packs/day: 0.50    Years: 10.00    Pack years: 5.00    Types: Cigarettes  . Smokeless tobacco: Never Used  Substance Use Topics  . Alcohol use: Yes    Comment: occasional  . Drug use: No     Allergies   Tetracyclines & related; Bactrim [sulfamethoxazole-trimethoprim]; and Ciprofloxacin   Review of Systems Review of Systems  Ten systems reviewed and are negative for acute change, except as noted in the HPI.   Physical Exam Updated Vital Signs BP (!) 130/91 (BP Location: Left Arm)   Pulse 75   Temp (!) 97.3 F (36.3 C) (Oral)   Resp 15   SpO2 97%   Physical Exam  Physical Exam  Nursing note and vitals reviewed. Constitutional: He appears well-developed and well-nourished. No distress.  HENT:  Head: Normocephalic and atraumatic.  Eyes: Conjunctivae normal are normal. No scleral icterus.  Neck: Normal range of motion. Neck supple.  Cardiovascular: Normal rate, regular rhythm and normal heart sounds.   Pulmonary/Chest: Effort normal and breath sounds normal. No respiratory distress.  Abdominal: Soft. There is no tenderness.  Musculoskeletal: He exhibits no edema.  Neurological: He is alert.  Skin: Skin is warm and dry. He is not diaphoretic.  Psychiatric: His behavior is normal.    ED Treatments / Results  Labs (all labs ordered are listed, but only abnormal results are displayed) Labs Reviewed  BASIC METABOLIC PANEL - Abnormal; Notable for the following components:      Result Value   Potassium 3.4  (*)    Glucose, Bld 117 (*)    All other components within normal limits  CBC  I-STAT TROPONIN, ED    EKG  EKG Interpretation  Date/Time:  Saturday January 28 2018 21:29:04 EST Ventricular Rate:  83 PR Interval:    QRS Duration: 92 QT Interval:  356 QTC Calculation: 419 R Axis:   90 Text Interpretation:  Sinus rhythm Probable left atrial enlargement Borderline right axis deviation no prior to compare with Confirmed by Aletta Edouard [phone]) on 01/28/2018 10:27:06 PM       Radiology No results found.  Procedures Procedures (including critical care time)  Medications Ordered in ED Medications - No data to display   Initial Impression / Assessment and Plan / ED Course  I have reviewed the triage vital signs and the nursing notes.  Pertinent labs & imaging results that were available during my care of the patient were reviewed by me and considered in my medical decision making (see chart for details).  Clinical Course as of Jan 28 2254  Sat Jan 28, 8642  7410 47 year old male complaining of acute onset shortness of breath chest pain while a passenger in the car to room by his wife.  He felt like he was nauseous and needed to get some fresh air.  Symptoms have improved on arrival here and he said he has minimal symptoms now.  He states he has had these episodes in the past and he calls them panic attacks.  He has had sounds like a stress test multiple years ago that was normal but otherwise does not follow with a doctor.  His initial EKG looks normal and his labs are benign.  He will need at least a delta troponin if he chooses to go home.  [MB]    Clinical Course User Index [MB] Hayden Rasmussen, MD   Patient heart score 2.  EKG without any abnormalities.  He has 2- troponins here in the emergency department.  Chest pain is gone.  Patient states this is exactly like his previous panic attacks. The patient was counseled for 5 min on the dangers of tobacco use, and was  advised to quit.  Reviewed strategies to maximize success, including removing cigarettes and smoking materials from environment, stress management, substitution of other forms of reinforcement, support of family/friends and written materials.   Final Clinical Impressions(s) / ED Diagnoses   Final diagnoses:  Panic attack  Tobacco abuse    ED Discharge Orders    None       Margarita Mail, PA-C 01/29/18 0455    Hayden Rasmussen, MD 01/30/18 [phone]

## 2018-01-29 LAB — I-STAT TROPONIN, ED: TROPONIN I, POC: 0 ng/mL (ref 0.00–0.08)

## 2018-01-29 NOTE — Discharge Instructions (Signed)
Contact a health care provider if: Your symptoms do not improve, or they get worse. You are not able to take your medicine as prescribed because of side effects. Get help right away if: You have serious thoughts about hurting yourself or others. You have symptoms of a panic attack. Do not drive yourself to the hospital. Have someone else drive you or call an ambulance.

## 2018-10-10 ENCOUNTER — Other Ambulatory Visit: Payer: Self-pay

## 2018-10-10 ENCOUNTER — Encounter (HOSPITAL_COMMUNITY): Payer: Self-pay | Admitting: Emergency Medicine

## 2018-10-10 ENCOUNTER — Emergency Department (HOSPITAL_COMMUNITY): Payer: Self-pay

## 2018-10-10 ENCOUNTER — Emergency Department (HOSPITAL_COMMUNITY)
Admission: EM | Admit: 2018-10-10 | Discharge: 2018-10-10 | Disposition: A | Discharge status: Home / Self Care | Payer: Self-pay | Attending: Emergency Medicine | Admitting: Emergency Medicine

## 2018-10-10 DIAGNOSIS — F17210 Nicotine dependence, cigarettes, uncomplicated: Secondary | ICD-10-CM | POA: Insufficient documentation

## 2018-10-10 DIAGNOSIS — N201 Calculus of ureter: Secondary | ICD-10-CM | POA: Insufficient documentation

## 2018-10-10 DIAGNOSIS — M549 Dorsalgia, unspecified: Secondary | ICD-10-CM | POA: Insufficient documentation

## 2018-10-10 LAB — CBC
HCT: 46.3 % (ref 39.0–52.0)
Hemoglobin: 15 g/dL (ref 13.0–17.0)
MCH: 29.7 pg (ref 26.0–34.0)
MCHC: 32.4 g/dL (ref 30.0–36.0)
MCV: 91.7 fL (ref 80.0–100.0)
Platelets: 234 10*3/uL (ref 150–400)
RBC: 5.05 MIL/uL (ref 4.22–5.81)
RDW: 12.6 % (ref 11.5–15.5)
WBC: 6 10*3/uL (ref 4.0–10.5)
nRBC: 0 % (ref 0.0–0.2)

## 2018-10-10 LAB — URINALYSIS, ROUTINE W REFLEX MICROSCOPIC
Bilirubin Urine: NEGATIVE
Glucose, UA: NEGATIVE mg/dL
Ketones, ur: 20 mg/dL — AB
Leukocytes, UA: NEGATIVE
Nitrite: NEGATIVE
Protein, ur: 30 mg/dL — AB
RBC / HPF: >50 RBC/hpf — ABNORMAL HIGH (ref 0–5)
Specific Gravity, Urine: 1.025 (ref 1.005–1.030)
pH: 5 (ref 5.0–8.0)

## 2018-10-10 LAB — COMPREHENSIVE METABOLIC PANEL
ALT: 23 U/L (ref 0–44)
AST: 19 U/L (ref 15–41)
Albumin: 4.7 g/dL (ref 3.5–5.0)
Alkaline Phosphatase: 80 U/L (ref 38–126)
Anion gap: 9 (ref 5–15)
BUN: 24 mg/dL — ABNORMAL HIGH (ref 6–20)
CO2: 24 mmol/L (ref 22–32)
Calcium: 9.3 mg/dL (ref 8.9–10.3)
Chloride: 107 mmol/L (ref 98–111)
Creatinine, Ser: 1.22 mg/dL (ref 0.61–1.24)
GFR calc Af Amer: >60 mL/min (ref >60)
GFR calc non Af Amer: >60 mL/min (ref >60)
Glucose, Bld: 135 mg/dL — ABNORMAL HIGH (ref 70–99)
Potassium: 4 mmol/L (ref 3.5–5.1)
Sodium: 140 mmol/L (ref 135–145)
Total Bilirubin: 1.4 mg/dL — ABNORMAL HIGH (ref 0.3–1.2)
Total Protein: 8.1 g/dL (ref 6.5–8.1)

## 2018-10-10 LAB — LIPASE, BLOOD: Lipase: 23 U/L (ref 11–51)

## 2018-10-10 MED ORDER — SODIUM CHLORIDE 0.9 % IV BOLUS
1000.0000 mL | Freq: Once | INTRAVENOUS | Status: AC
  Administered 2018-10-10: 1000 mL via INTRAVENOUS

## 2018-10-10 MED ORDER — KETOROLAC TROMETHAMINE 60 MG/2ML IM SOLN
30.0000 mg | Freq: Once | INTRAMUSCULAR | Status: AC
  Administered 2018-10-10: 30 mg via INTRAMUSCULAR
  Filled 2018-10-10: qty 2

## 2018-10-10 MED ORDER — OXYCODONE-ACETAMINOPHEN 5-325 MG PO TABS: 1.0000 | ORAL_TABLET | ORAL | 0 refills | Status: DC | PRN

## 2018-10-10 MED ORDER — ONDANSETRON HCL 4 MG PO TABS: 4.0000 mg | ORAL_TABLET | Freq: Four times a day (QID) | ORAL | 0 refills | Status: DC

## 2018-10-10 MED ORDER — HYDROMORPHONE HCL 1 MG/ML IJ SOLN
1.0000 mg | Freq: Once | INTRAMUSCULAR | Status: AC
  Administered 2018-10-10: 1 mg via INTRAMUSCULAR
  Filled 2018-10-10: qty 1

## 2018-10-10 NOTE — ED Triage Notes (Signed)
Pt c/o flank pains that radiate to left side.

## 2018-10-10 NOTE — ED Provider Notes (Signed)
[place] DEPT Provider Note   CSN: [id number] Arrival date & time: 10/10/18  1436     History   Chief Complaint Chief Complaint  Patient presents with  . Flank Pain  . Abdominal Pain    HPI Manuel Gomez is a 47 y.o. male.  HPI   47 year old male with left mid back left flank pain.  Onset this morning.  Began as an ache in his left mid back.  Worsening pain which began radiating into his flank.  Pain is waxed and waned since onset but not completely gone away.  He has not noticed any appreciable exacerbating relieving factors.  Nauseated at times.  No vomiting.  No urinary complaints.  No history of similar symptoms.  No fevers.  He is tried taken Tylenol with mild improvement.  Past Medical History:  Diagnosis Date  . Anxiety   . GERD (gastroesophageal reflux disease)   . Seminoma of left testis ([place])   . Tinnitus of both ears   . Wears glasses     Patient Active Problem List   Diagnosis Date Noted  . Seminoma of descended left testis ([place]) 02/17/2015    Past Surgical History:  Procedure Laterality Date  . ORCHIECTOMY Left 01/03/2015   Procedure: LEFT RADICAL INGUINAL ORCHIECTOMY;  Surgeon: Ailene Rud, MD;  [facility];  Service: Urology;  Laterality: Left;        Home Medications    Prior to Admission medications   Medication Sig Start Date End Date Taking? Authorizing Provider  acetaminophen (TYLENOL) 500 MG tablet Take 500 mg by mouth every 6 (six) hours as needed for moderate pain.    [provider]    Family History Family History  Problem Relation Age of Onset  . Cancer Neg Hx     Social History Social History   Tobacco Use  . Smoking status: Current Every Day Smoker    Packs/day: 0.50    Years: 10.00    Pack years: 5.00    Types: Cigarettes  . Smokeless tobacco: Never Used  Substance Use Topics  . Alcohol use: Yes    Comment: occasional  . Drug use: No      Allergies   Tetracyclines & related; Bactrim [sulfamethoxazole-trimethoprim]; and Ciprofloxacin   Review of Systems Review of Systems  All systems reviewed and negative, other than as noted in HPI.  Physical Exam Updated Vital Signs BP (!) 149/107 (BP Location: Left Arm)   Pulse 60   Temp 98.5 F (36.9 C) (Oral)   Resp 15   Ht 6\' 1"  (1.854 m)   Wt 83 kg   SpO2 98%   BMI 24.14 kg/m   Physical Exam  Constitutional: He appears well-developed and well-nourished. No distress.  HENT:  Head: Normocephalic and atraumatic.  Eyes: Conjunctivae are normal. Right eye exhibits no discharge. Left eye exhibits no discharge.  Neck: Neck supple.  Cardiovascular: Normal rate, regular rhythm and normal heart sounds. Exam reveals no gallop and no friction rub.  No murmur heard. Pulmonary/Chest: Effort normal and breath sounds normal. No respiratory distress.  Abdominal: Soft. He exhibits no distension. There is tenderness.  L sided abdominal tenderness w/u rebound or guarding. No distension.   Musculoskeletal: He exhibits no edema or tenderness.  Neurological: He is alert.  Skin: Skin is warm and dry.  Psychiatric: He has a normal mood and affect. His behavior is normal. Thought content normal.  Nursing note and vitals reviewed.    ED  Treatments / Results  Labs (all labs ordered are listed, but only abnormal results are displayed) Labs Reviewed  COMPREHENSIVE METABOLIC PANEL - Abnormal; Notable for the following components:      Result Value   Glucose, Bld 135 (*)    BUN 24 (*)    Total Bilirubin 1.4 (*)    All other components within normal limits  URINALYSIS, ROUTINE W REFLEX MICROSCOPIC - Abnormal; Notable for the following components:   Color, Urine AMBER (*)    APPearance CLOUDY (*)    Hgb urine dipstick LARGE (*)    Ketones, ur 20 (*)    Protein, ur 30 (*)    RBC / HPF >50 (*)    Bacteria, UA RARE (*)    All other components within normal limits  LIPASE, BLOOD   CBC    EKG None  Radiology No results found.   Ct Renal Stone Study  Result Date: 10/10/2018 CLINICAL DATA:  47 year old male with history of flank pain radiating to the left side since this morning. History of testicular cancer. EXAM: CT ABDOMEN AND PELVIS WITHOUT CONTRAST TECHNIQUE: Multidetector CT imaging of the abdomen and pelvis was performed following the standard protocol without IV contrast. COMPARISON:  CT the abdomen and pelvis 01/13/2018. FINDINGS: Lower chest: Unremarkable. Hepatobiliary: Multiple small subcentimeter low-attenuation lesions scattered throughout the hepatic parenchyma, incompletely characterized on today's noncontrast CT examination, but similar to several prior examinations, statistically likely to represent tiny cysts and/or biliary hamartomas. No other definite larger more suspicious appearing hepatic lesions are confidently identified on today's noncontrast CT examination. Unenhanced appearance of the gallbladder is normal. Pancreas: No definite pancreatic mass or peripancreatic fluid or inflammatory changes are noted on today's noncontrast CT examination. Spleen: Unremarkable. Adrenals/Urinary Tract: 2 mm nonobstructive calculus in the interpolar collecting system of the left kidney. In addition, in the distal third of the left ureter there is a 5 mm calculus. No additional calculi are noted in the collecting system of the right kidney, along the course of the right ureter or within the lumen of the urinary bladder. No associated hydroureteronephrosis at this time. Unenhanced appearance of the kidneys and bilateral adrenal glands is otherwise normal. Urinary bladder is normal in appearance. Stomach/Bowel: Normal appearance of the stomach. No pathologic dilatation of small bowel or colon. Normal appendix. Vascular/Lymphatic: Aortic atherosclerosis. No definite lymphadenopathy noted in the abdomen or pelvis. Reproductive: Postoperative changes of left orchiectomy.  Prostate gland and seminal vesicles are unremarkable in appearance. Other: No significant volume of ascites.  No pneumoperitoneum. 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 There are no aggressive appearing lytic or blastic lesions noted in the visualized portions of the skeleton. IMPRESSION: 1. 5 mm calculus in the distal third of the left ureter. No associated proximal hydroureteronephrosis to indicate urinary tract obstruction at this time. 2. 2 mm nonobstructive calculus also noted in the interpolar collecting system of the left kidney. 3. No findings to suggest metastatic disease in the abdomen or pelvis. 4. Normal appendix. 5. Aortic atherosclerosis. Electronically Signed   By: Vinnie Langton M.D.   On: 10/10/2018 18:42    Procedures Procedures (including critical care time)  Medications Ordered in ED Medications - No data to display   Initial Impression / Assessment and Plan / ED Course  I have reviewed the triage vital signs and the nursing notes.  Pertinent labs & imaging results that were available during my care of the patient were reviewed by  me and considered in my medical decision making (see chart for details).     47 year old male with flank pain.  Symptoms sent CT consistent with ureteral stone.  Symptoms now controlled.  Is afebrile.  Plan continue symptomatic treatment and expectant management.  Emergent return precautions were discussed.  Urology follow-up as needed otherwise.  Final Clinical Impressions(s) / ED Diagnoses   Final diagnoses:  Ureteral stone    ED Discharge Orders    None       Virgel Manifold, MD 10/19/18 [phone]

## 2018-10-10 NOTE — ED Notes (Signed)
Requested patient to urinate. 

## 2018-10-10 NOTE — ED Notes (Signed)
Need bolus before being discharged.

## 2019-01-10 IMAGING — CT CT RENAL STONE PROTOCOL
2 of 4 series · 15 of 46 positions shown, 17 images · non-contrast
Comparison: CT the abdomen and pelvis 01/13/2018.

CLINICAL DATA: 46-year-old male with history of flank pain
radiating to the left side since this morning. History of testicular
cancer.

EXAM:
CT ABDOMEN AND PELVIS WITHOUT CONTRAST
TECHNIQUE: Multidetector CT imaging of the abdomen and pelvis was performed
following the standard protocol without IV contrast.

[Series 2: axial st · axial · 0.66mm/px · z∈[+1215,+1645]mm · 12 of 98 slices shown, 14 images]
[im 6/98  soft-tissue]
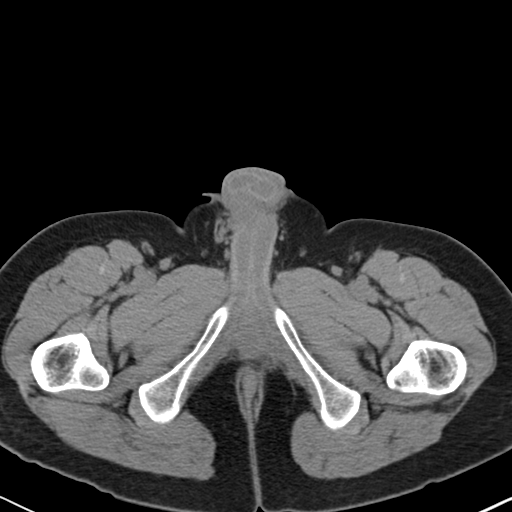
[im 6/98  bone]
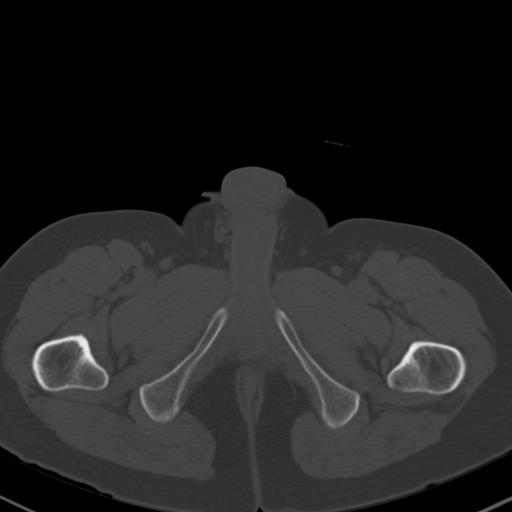
[im 18/98  soft-tissue]
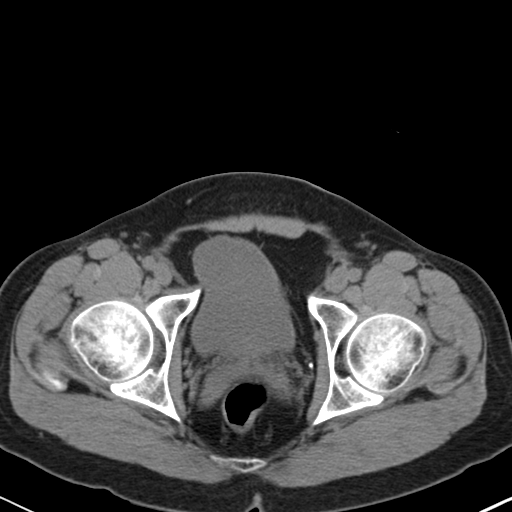
[im 23/98  soft-tissue]
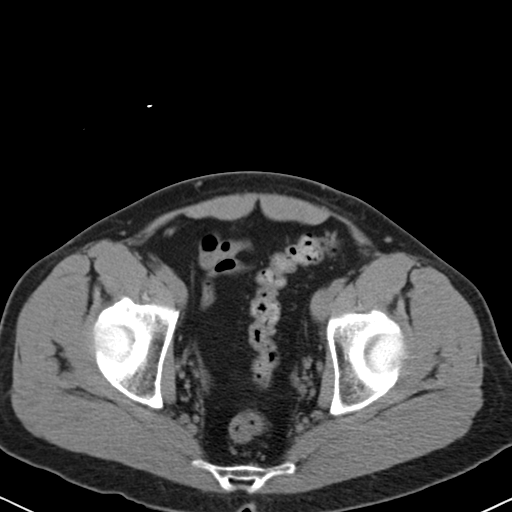
[im 29/98  soft-tissue]
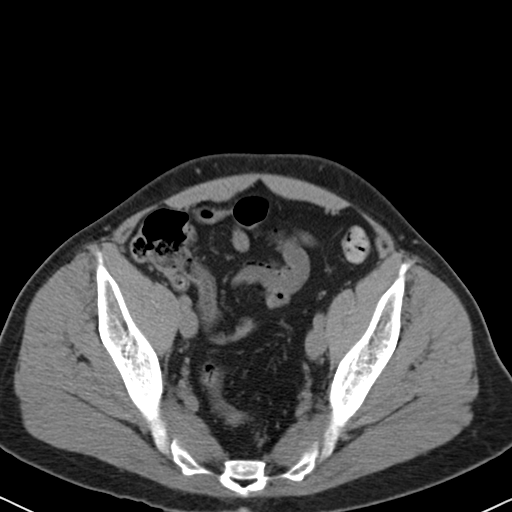
[im 40/98  soft-tissue]
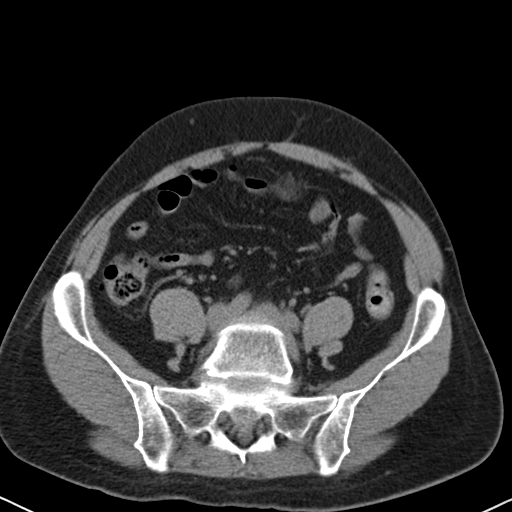
[im 46/98  soft-tissue]
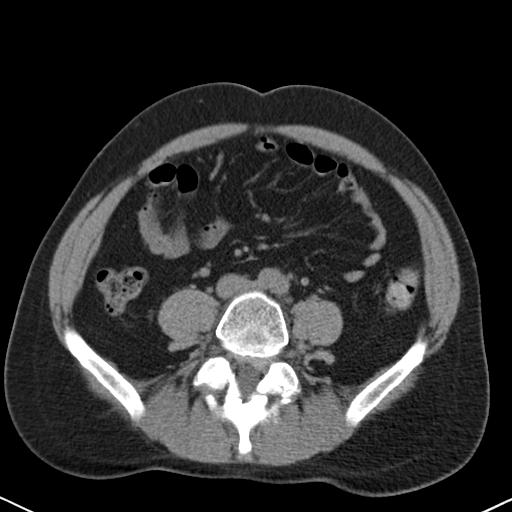
[im 52/98  soft-tissue]
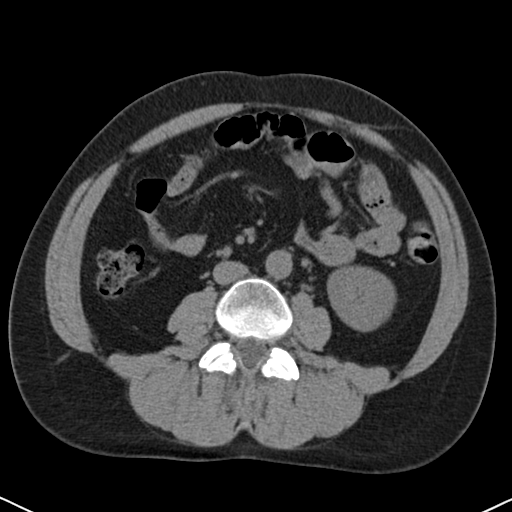
[im 63/98  soft-tissue]
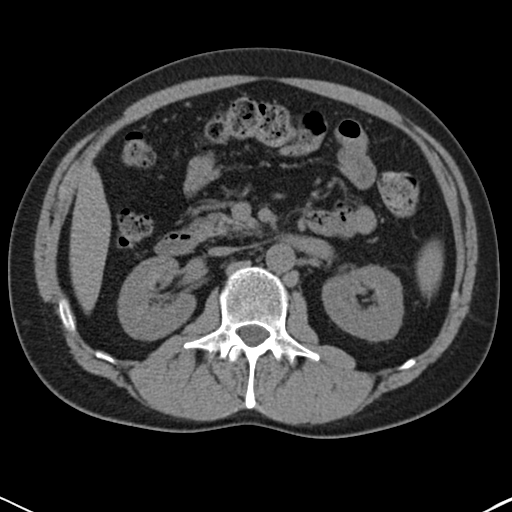
[im 69/98  soft-tissue]
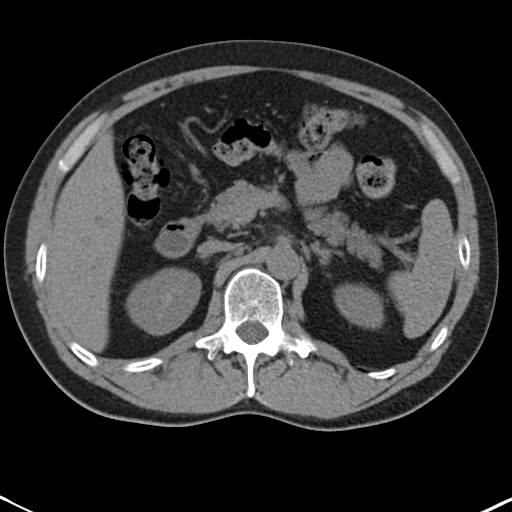
[im 69/98  bone]
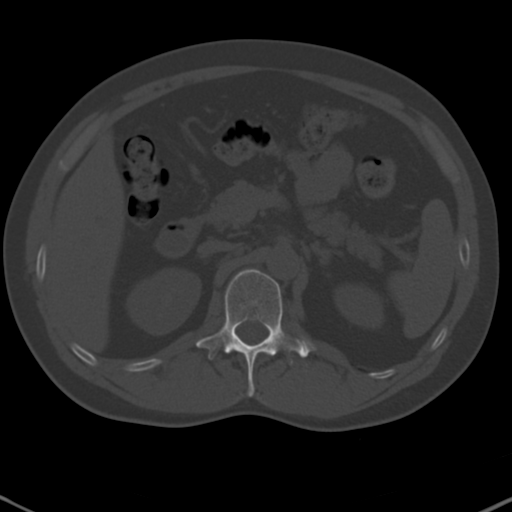
[im 75/98  soft-tissue]
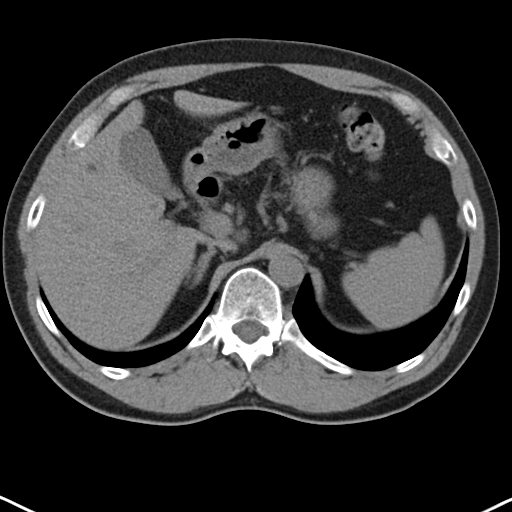
[im 86/98  soft-tissue]
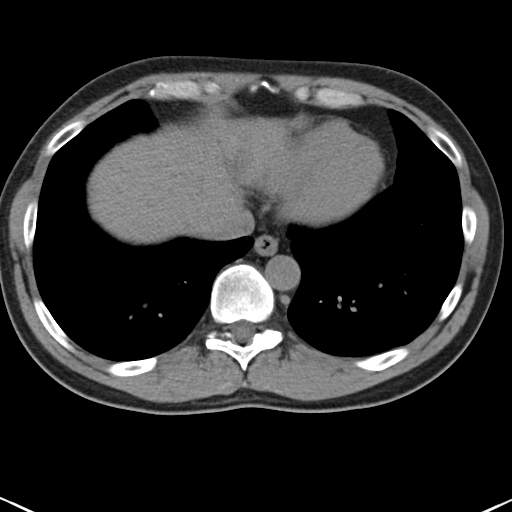
[im 92/98  soft-tissue]
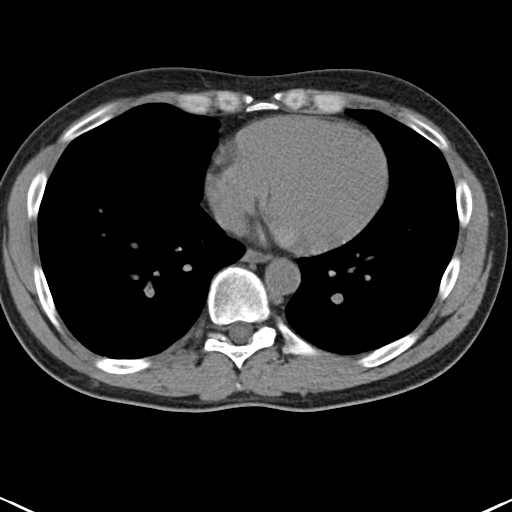

[Series 4: coronal · coronal · 0.64mm/px · 3 of 132 slices shown]
[im 44/132  soft-tissue]
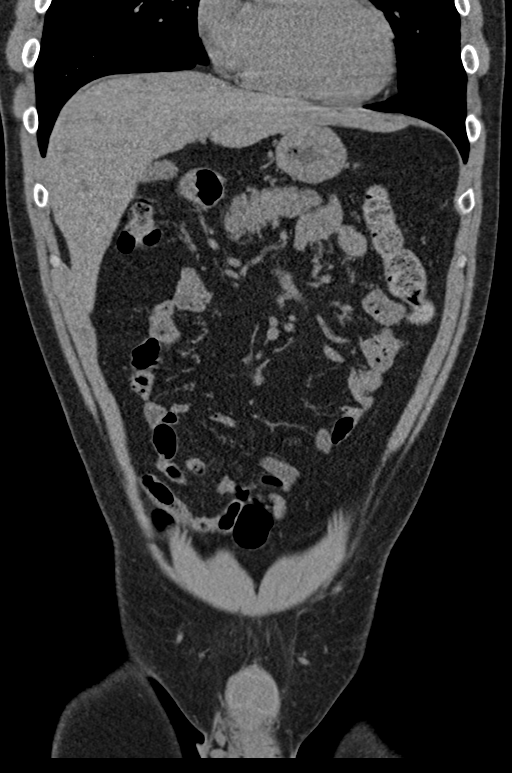
[im 59/132  soft-tissue]
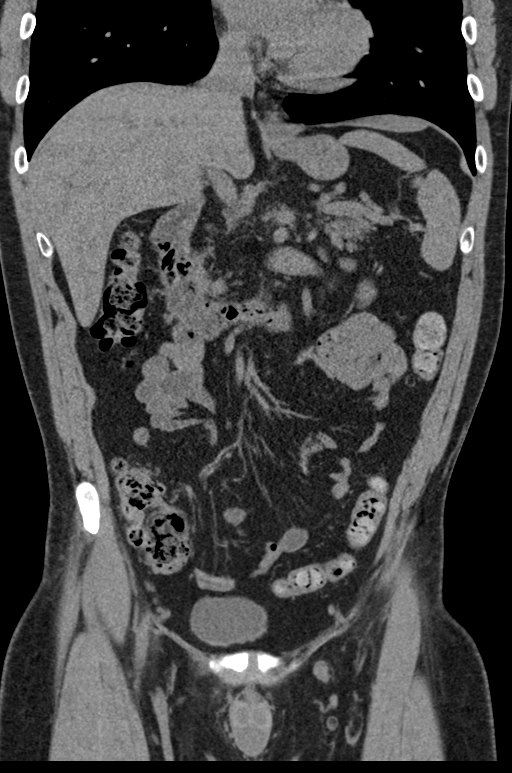
[im 73/132  soft-tissue]
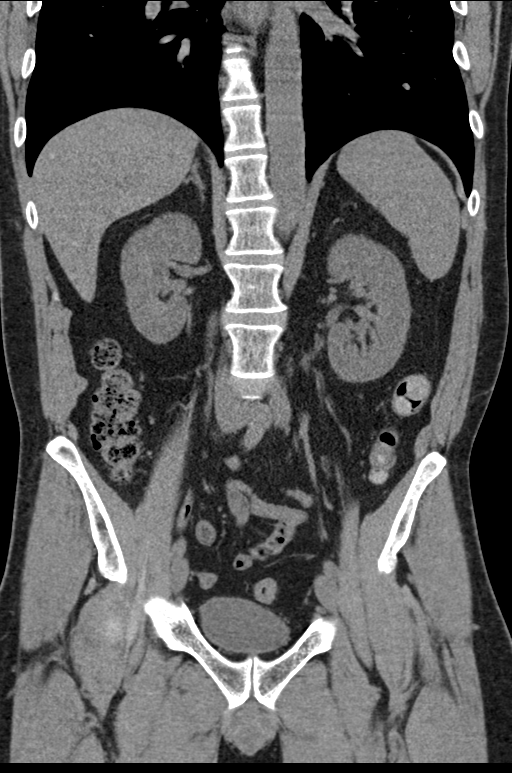

[15 of 46 positions shown; findings below may reference images not displayed]

FINDINGS: Lower chest: Unremarkable.

Hepatobiliary: Multiple small subcentimeter low-attenuation lesions
scattered throughout the hepatic parenchyma, incompletely
characterized on today's noncontrast CT examination, but similar to
several prior examinations, statistically likely to represent tiny
cysts and/or biliary hamartomas. No other definite larger more
suspicious appearing hepatic lesions are confidently identified on
today's noncontrast CT examination. Unenhanced appearance of the
gallbladder is normal.

Pancreas: No definite pancreatic mass or peripancreatic fluid or
inflammatory changes are noted on today's noncontrast CT
examination.

Spleen: Unremarkable.

Adrenals/Urinary Tract: 2 mm nonobstructive calculus in the
interpolar collecting system of the left kidney. In addition, in the
distal third of the left ureter there is a 5 mm calculus. No
additional calculi are noted in the collecting system of the right
kidney, along the course of the right ureter or within the lumen of
the urinary bladder. No associated hydroureteronephrosis at this
time. Unenhanced appearance of the kidneys and bilateral adrenal
glands is otherwise normal. Urinary bladder is normal in appearance.

Stomach/Bowel: Normal appearance of the stomach. No pathologic
dilatation of small bowel or colon. Normal appendix.

Vascular/Lymphatic: Aortic atherosclerosis. No definite
lymphadenopathy noted in the abdomen or pelvis.

Reproductive: Postoperative changes of left orchiectomy. Prostate
gland and seminal vesicles are unremarkable in appearance.

Other: No significant volume of ascites.  No pneumoperitoneum.

Musculoskeletal: Bilateral pars defects at L5 with grade 1
anterolisthesis (5 mm) of L5 upon S1. There are no aggressive
appearing lytic or blastic lesions noted in the visualized portions
of the skeleton.
IMPRESSION: 1. 5 mm calculus in the distal third of the left ureter. No
associated proximal hydroureteronephrosis to indicate urinary tract
obstruction at this time.
2. 2 mm nonobstructive calculus also noted in the interpolar
collecting system of the left kidney.
3. No findings to suggest metastatic disease in the abdomen or
pelvis.
4. Normal appendix.
5. Aortic atherosclerosis.

## 2023-03-16 ENCOUNTER — Other Ambulatory Visit: Payer: Self-pay | Admitting: Urology

## 2023-03-18 ENCOUNTER — Other Ambulatory Visit: Payer: Self-pay | Admitting: Urology

## 2023-03-18 NOTE — Progress Notes (Signed)
Talked with patient. Instructions given. Arrival time 0600. NPO after MN. Daughter is the driver. Meds and hx reviewed. Bring in blue folder

## 2023-03-18 NOTE — H&P (Signed)
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  Stage: pT1  Treatment: Observation.   He now comes in with 70-month history of red patch on penile glans. It is not painful and has somewhat decreased in size over the last 2 months. He denies any bleeding, ulceration, change in lower urinary tract symptoms or new sexual partners. He did see his PCP regarding this. Patient was last seen in [facility] in [facility]. At that time he was 3 years from orchiectomy. He has not had a recent surveillance CT scan.   01/06/2023: severe right-sided pain and discomfort with associated nausea. He had a CT of the abdomen and pelvis ordered by his primary care provider and this was done at an outside facility in [place] [facility]. Per report - showed an obstructing 4 mm RIGHT proximal ureteral calculi. There was also a 9 mm LEFT upper pole nonobstructing calculi. He was started on tamsulosin and also prescribed Percocet for management of his pain. He cannot tolerate Percocet endorsing a buzzing and itching sensation over his entire body. He has been trying to manage his pain with Tylenol. He has had a couple prior stone events but historically has been able to pass them, he has never required surgical intervention for an obstructing stone.   03/16/2023: added on today for . He did not f/u in Feb 2024 for right renal US as the KUB was equivocal. He reports seeing a stone pass in Feb 2024. About two days ago he developed LEFT flank pain. He is staying hydrated with a large bottle of water. Tyenol helps. No fever. No gross hematuria. UA today with 20-40 rbcs. CT images from Jan 2024 reviewed. Declined toradol.   KUB today with an 8 mm left distal stone. Appears the stone that was in his left kidney is now passed and is  in the left distal ureter when looking back at his CT.   He is a Medical illustrator - Camera operator.       ALLERGIES: Bactrim TABS Ciprofloxacin HCl TABS sulfamethoxazole-trimethoprim Tetracycline    MEDICATIONS: Ketorolac Tromethamine 10 mg tablet 1 tablet PO Q 8 H PRN For severe pain  Tamsulosin Hcl 0.4 mg capsule  Hydrocodone-Acetaminophen 5 mg-325 mg tablet 1 tablet PO Q 6 H PRN     GU PSH: Locm 300-399Mg /Ml Iodine,1Ml - 2017 Radical Orchiectomy - 2016     NON-GU PSH: No Non-GU PSH    GU PMH: Ureteral calculus - 01/06/2023 Disorder of Penis Ot - 01/08/2022 History of testicular cancer, Left - 01/08/2022, (Stable), Left, He had a pure seminoma with normal tumor markers. I will obtain his annual CT scan. Tumor markers are not indicated in this situation. 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  A CT scan in 2/19 revealed a single left renal calculus only.   Hypogonadism: After the removal of his left testicle with Leydig cell hyperplasia is  serum testosterone levels decreased and in 9/16 had fallen to 267. He continued to maintain good erectile quality and was minimally symptomatic. He elected not to undergo treatment  Cancer, kidney stones.      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 Smokes 1 pack per day.  Does drink.  Drinks 4+ caffeinated drinks per day. Patient's occupation Location manager.     Notes: 1 daughter   REVIEW OF SYSTEMS:    GU Review Male:   Patient denies frequent urination, hard to postpone urination, burning/ pain with urination, get up at night to urinate, leakage of urine, stream starts and stops, trouble starting your stream, have to strain to urinate , erection problems, and penile pain.  Gastrointestinal (Upper):   Patient denies nausea, vomiting, and indigestion/ heartburn.  Gastrointestinal (Lower):   Patient denies diarrhea and constipation.  Constitutional:   Patient denies fever, night sweats, weight loss, and fatigue.  Skin:   Patient denies skin rash/ lesion and itching.  Eyes:   Patient denies blurred vision and double vision.  Ears/ Nose/ Throat:   Patient denies sore throat and sinus problems.  Hematologic/Lymphatic:   Patient denies easy bruising and swollen glands.  Cardiovascular:   Patient denies leg swelling and chest pains.  Respiratory:   Patient denies cough and shortness of breath.  Endocrine:   Patient denies excessive thirst.  Musculoskeletal:   Patient denies back pain and joint pain.  Neurological:   Patient denies headaches and dizziness.  Psychologic:   Patient denies depression and anxiety.   VITAL SIGNS: None   MULTI-SYSTEM PHYSICAL EXAMINATION:    Constitutional: Well-nourished. No physical deformities. Normally developed. Good grooming.  Neck: Neck symmetrical, not swollen. Normal tracheal position.  Respiratory: No labored breathing, no use of accessory muscles.   Cardiovascular: Normal temperature, normal extremity pulses, no swelling, no varicosities.  Skin: No paleness, no jaundice,  no cyanosis. No lesion, no ulcer, no rash.  Neurologic / Psychiatric: Oriented to time, oriented to place, oriented to person. No depression, no anxiety, no agitation.  Gastrointestinal: No mass, no tenderness, no rigidity, non obese abdomen.     Complexity of Data:  X-Ray Review: KUB: Reviewed Films. Discussed With Patient. 2024 C.T. Abdomen/Pelvis: Reviewed Films. Discussed With Patient. 2024    07/30/16  PSA  Total PSA 2.9 ng/dl    16/10/96 04/54/09 81/19/14 06/25/15 01/10/15 12/11/14  Hormones  Testosterone, Total 316.8 pg/dL 782  956  213  086  578     PROCEDURES:         KUB - 74018  A single view of the abdomen is obtained.  Calculi:  8 Millimeter left distal stone      The bones appeared normal. The bowel gas pattern appeared normal. The soft tissues were unremarkable.  Patient confirmed No Neulasta OnPro Device.           Urinalysis w/Scope - 81001 Dipstick Dipstick Cont'd Micro  Color: Amber Bilirubin: Neg WBC/hpf: 0 - 5/hpf  Appearance: Cloudy Ketones: 1+ RBC/hpf: 20 - 40/hpf  Specific Gravity: 1.025 Blood: 3+ Bacteria: Mod (26-50/hpf)  pH: <=5.0 Protein: 3+ Cystals: NS (Not  Seen)  Glucose: Neg Urobilinogen: 0.2 Casts: NS (Not Seen)    Nitrites: Neg Trichomonas: Not Present    Leukocyte Esterase: Neg Mucous: Present      Epithelial Cells: 0 - 5/hpf      Yeast: NS (Not Seen)      Sperm: Not Present    Notes:      ASSESSMENT:      ICD-10 Details  1 GU:   Microscopic hematuria - R31.21 Chronic, Stable - cover with abx as a precaution   2   Renal calculus - N20.0 Chronic, Stable - Stone removed to the left ureter.  3   Ureteral calculus - N20.1 Chronic, Stable - I discussed with the patient the nature risks and benefits of continued stone passage, off label use of alpha blockers, shockwave lithotripsy or ureteroscopy. All questions answered.   We discussed chances of success and need for staged procedures.   Discussed a colleague would be doing the case.    PLAN:            Medications New Meds: Cephalexin 500 mg capsule 1 capsule PO BID   #10  0 Refill(s)  Pharmacy Name:  [id number]  Address:  [address]   [place], [place] [address]  Phone:  [phone]  Fax:  [phone]            Orders Labs Urine Culture  X-Rays: KUB          Schedule Return Visit/Planned Activity: Next Available Appointment - Schedule Surgery          Document Letter(s):  Created for Patient: Clinical Summary         Notes:   cc: Dr. Lillie Columbia; Dr. Arita Miss         Next Appointment:      Next Appointment: 03/21/2023 07:30 AM    Appointment Type: Surgery     Location: [facility] [phone]    Provider: Karoline Caldwell, M.D.    Reason for Visit: OP NE LT ESWL

## 2023-03-21 ENCOUNTER — Encounter (HOSPITAL_BASED_OUTPATIENT_CLINIC_OR_DEPARTMENT_OTHER): Payer: Self-pay | Admitting: Urology

## 2023-03-21 ENCOUNTER — Ambulatory Visit (HOSPITAL_BASED_OUTPATIENT_CLINIC_OR_DEPARTMENT_OTHER)
Admission: RE | Admit: 2023-03-21 | Discharge: 2023-03-21 | Disposition: A | Discharge status: Home / Self Care | Payer: BC Managed Care – PPO | Source: Ambulatory Visit | Attending: Urology | Admitting: Urology

## 2023-03-21 ENCOUNTER — Encounter (HOSPITAL_BASED_OUTPATIENT_CLINIC_OR_DEPARTMENT_OTHER)
Admission: RE | Disposition: A | Discharge status: Home / Self Care | Payer: Self-pay | Source: Ambulatory Visit | Attending: Urology

## 2023-03-21 ENCOUNTER — Ambulatory Visit (HOSPITAL_COMMUNITY): Payer: BC Managed Care – PPO

## 2023-03-21 DIAGNOSIS — Z79899 Other long term (current) drug therapy: Secondary | ICD-10-CM | POA: Diagnosis not present

## 2023-03-21 DIAGNOSIS — N201 Calculus of ureter: Secondary | ICD-10-CM | POA: Insufficient documentation

## 2023-03-21 DIAGNOSIS — Z8547 Personal history of malignant neoplasm of testis: Secondary | ICD-10-CM | POA: Diagnosis not present

## 2023-03-21 HISTORY — PX: EXTRACORPOREAL SHOCK WAVE LITHOTRIPSY: SHX1557

## 2023-03-21 SURGERY — LITHOTRIPSY, ESWL
Anesthesia: LOCAL | Laterality: Left

## 2023-03-21 MED ORDER — DIPHENHYDRAMINE HCL 25 MG PO CAPS
ORAL_CAPSULE | ORAL | Status: AC
  Filled 2023-03-21: qty 1

## 2023-03-21 MED ORDER — SODIUM CHLORIDE 0.9 % IV SOLN: INTRAVENOUS | Status: DC

## 2023-03-21 MED ORDER — DIAZEPAM 5 MG PO TABS
10.0000 mg | ORAL_TABLET | ORAL | Status: AC
  Administered 2023-03-21: 10 mg via ORAL

## 2023-03-21 MED ORDER — DIAZEPAM 5 MG PO TABS
ORAL_TABLET | ORAL | Status: AC
  Filled 2023-03-21: qty 2

## 2023-03-21 MED ORDER — OXYCODONE-ACETAMINOPHEN 5-325 MG PO TABS: 1.0000 | ORAL_TABLET | ORAL | 0 refills | Status: DC | PRN

## 2023-03-21 MED ORDER — DIPHENHYDRAMINE HCL 25 MG PO CAPS
25.0000 mg | ORAL_CAPSULE | ORAL | Status: AC
  Administered 2023-03-21: 25 mg via ORAL

## 2023-03-21 MED ORDER — CEFAZOLIN SODIUM-DEXTROSE 2-4 GM/100ML-% IV SOLN
INTRAVENOUS | Status: AC
  Filled 2023-03-21: qty 100

## 2023-03-21 MED ORDER — CEFAZOLIN SODIUM-DEXTROSE 2-4 GM/100ML-% IV SOLN
2.0000 g | Freq: Once | INTRAVENOUS | Status: AC
  Administered 2023-03-21: 2 g via INTRAVENOUS

## 2023-03-21 NOTE — Discharge Instructions (Signed)

## 2023-03-21 NOTE — Interval H&P Note (Signed)
History and Physical Interval Note:  03/21/2023 9:21 AM  Manuel Gomez  has presented today for surgery, with the diagnosis of LEFT URETERAL STONE.  The various methods of treatment have been discussed with the patient and family. After consideration of risks, benefits and other options for treatment, the patient has consented to  Procedure(s): LEFT EXTRACORPOREAL SHOCK WAVE LITHOTRIPSY (ESWL) (Left) as a surgical intervention.  The patient's history has been reviewed, patient examined, no change in status, stable for surgery.  I have reviewed the patient's chart and labs.  Questions were answered to the patient's satisfaction.     Belva Agee

## 2023-03-21 NOTE — Op Note (Signed)
ESWL Operative Note  Treating Physician: Belva Agee, MD  Pre-op diagnosis: Left distal ureteral calculus  Post-op diagnosis: Same   Procedure: Left ureteral ESWL  AES:[id number]  See Rojelio Brenner OP note scanned into chart. Also because of the size, density, location and other factors that cannot be anticipated I feel this will likely be a staged procedure. This fact supersedes any indication in the scanned [place] stone operative note to the contrary.  Yevonne Aline [facility]

## 2023-03-22 ENCOUNTER — Encounter (HOSPITAL_BASED_OUTPATIENT_CLINIC_OR_DEPARTMENT_OTHER): Payer: Self-pay | Admitting: Urology

## 2023-04-19 ENCOUNTER — Encounter (HOSPITAL_BASED_OUTPATIENT_CLINIC_OR_DEPARTMENT_OTHER): Payer: Self-pay | Admitting: Urology

## 2023-04-19 ENCOUNTER — Other Ambulatory Visit: Payer: Self-pay | Admitting: Urology

## 2023-04-19 NOTE — Progress Notes (Signed)
Spoke w/ via phone for pre-op interview--- Rocky Crafts needs dos----  NONE             Lab results------ COVID test -----patient states asymptomatic no test needed Arrive at -------0530 NPO after MN NO Solid Food.   Med rec completed Medications to take morning of surgery -----Percocet and Zofran PRN Diabetic medication ----- Patient instructed no nail polish to be worn day of surgery Patient instructed to bring photo id and insurance card day of surgery Patient aware to have Driver (ride ) / caregiver   Daughter Toni Amend for 24 hours after surgery  Patient Special Instructions ----- Pre-Op special Instructions ----- Patient verbalized understanding of instructions that were given at this phone interview. Patient denies shortness of breath, chest pain, fever, cough at this phone interview.

## 2023-05-03 ENCOUNTER — Encounter (HOSPITAL_BASED_OUTPATIENT_CLINIC_OR_DEPARTMENT_OTHER)
Admission: RE | Disposition: A | Discharge status: Home / Self Care | Payer: Self-pay | Source: Ambulatory Visit | Attending: Urology

## 2023-05-03 ENCOUNTER — Ambulatory Visit (HOSPITAL_BASED_OUTPATIENT_CLINIC_OR_DEPARTMENT_OTHER): Payer: BC Managed Care – PPO | Admitting: Anesthesiology

## 2023-05-03 ENCOUNTER — Encounter (HOSPITAL_BASED_OUTPATIENT_CLINIC_OR_DEPARTMENT_OTHER): Payer: Self-pay | Admitting: Urology

## 2023-05-03 ENCOUNTER — Ambulatory Visit (HOSPITAL_BASED_OUTPATIENT_CLINIC_OR_DEPARTMENT_OTHER)
Admission: RE | Admit: 2023-05-03 | Discharge: 2023-05-03 | Disposition: A | Discharge status: Home / Self Care | Payer: BC Managed Care – PPO | Source: Ambulatory Visit | Attending: Urology | Admitting: Urology

## 2023-05-03 ENCOUNTER — Other Ambulatory Visit: Payer: Self-pay

## 2023-05-03 DIAGNOSIS — N201 Calculus of ureter: Secondary | ICD-10-CM | POA: Insufficient documentation

## 2023-05-03 DIAGNOSIS — Z8547 Personal history of malignant neoplasm of testis: Secondary | ICD-10-CM | POA: Insufficient documentation

## 2023-05-03 HISTORY — PX: CYSTOSCOPY/URETEROSCOPY/HOLMIUM LASER/STENT PLACEMENT: SHX6546

## 2023-05-03 HISTORY — DX: Personal history of urinary calculi: Z87.442

## 2023-05-03 SURGERY — CYSTOSCOPY/URETEROSCOPY/HOLMIUM LASER/STENT PLACEMENT
Anesthesia: General | Site: Ureter | Laterality: Left

## 2023-05-03 MED ORDER — FENTANYL CITRATE (PF) 100 MCG/2ML IJ SOLN: 25.0000 ug | INTRAMUSCULAR | Status: DC | PRN

## 2023-05-03 MED ORDER — CEFAZOLIN SODIUM-DEXTROSE 2-4 GM/100ML-% IV SOLN
2.0000 g | INTRAVENOUS | Status: AC
  Administered 2023-05-03: 2 g via INTRAVENOUS

## 2023-05-03 MED ORDER — ACETAMINOPHEN 500 MG PO TABS
ORAL_TABLET | ORAL | Status: AC
  Filled 2023-05-03: qty 2

## 2023-05-03 MED ORDER — PROPOFOL 10 MG/ML IV BOLUS
INTRAVENOUS | Status: AC
  Filled 2023-05-03: qty 20

## 2023-05-03 MED ORDER — LIDOCAINE 2% (20 MG/ML) 5 ML SYRINGE
INTRAMUSCULAR | Status: DC | PRN
  Administered 2023-05-03: 60 mg via INTRAVENOUS

## 2023-05-03 MED ORDER — OXYCODONE HCL 5 MG/5ML PO SOLN: 5.0000 mg | Freq: Once | ORAL | Status: DC | PRN

## 2023-05-03 MED ORDER — DEXMEDETOMIDINE HCL IN NACL 80 MCG/20ML IV SOLN
INTRAVENOUS | Status: DC | PRN
  Administered 2023-05-03: 4 ug via INTRAVENOUS

## 2023-05-03 MED ORDER — 0.9 % SODIUM CHLORIDE (POUR BTL) OPTIME
TOPICAL | Status: DC | PRN
  Administered 2023-05-03: 500 mL

## 2023-05-03 MED ORDER — FENTANYL CITRATE (PF) 100 MCG/2ML IJ SOLN
INTRAMUSCULAR | Status: AC
  Filled 2023-05-03: qty 2

## 2023-05-03 MED ORDER — OXYCODONE HCL 5 MG PO TABS: 5.0000 mg | ORAL_TABLET | Freq: Once | ORAL | Status: DC | PRN

## 2023-05-03 MED ORDER — ONDANSETRON HCL 4 MG/2ML IJ SOLN
INTRAMUSCULAR | Status: AC
  Filled 2023-05-03: qty 2

## 2023-05-03 MED ORDER — CEFAZOLIN SODIUM-DEXTROSE 2-4 GM/100ML-% IV SOLN
INTRAVENOUS | Status: AC
  Filled 2023-05-03: qty 100

## 2023-05-03 MED ORDER — FENTANYL CITRATE (PF) 100 MCG/2ML IJ SOLN
INTRAMUSCULAR | Status: DC | PRN
  Administered 2023-05-03 (×2): 50 ug via INTRAVENOUS

## 2023-05-03 MED ORDER — TAMSULOSIN HCL 0.4 MG PO CAPS: 0.4000 mg | ORAL_CAPSULE | Freq: Every day | ORAL | 0 refills | Status: DC

## 2023-05-03 MED ORDER — LIDOCAINE HCL (PF) 2 % IJ SOLN
INTRAMUSCULAR | Status: AC
  Filled 2023-05-03: qty 5

## 2023-05-03 MED ORDER — ACETAMINOPHEN 500 MG PO TABS
1000.0000 mg | ORAL_TABLET | Freq: Once | ORAL | Status: AC
  Administered 2023-05-03: 1000 mg via ORAL

## 2023-05-03 MED ORDER — DEXAMETHASONE SODIUM PHOSPHATE 4 MG/ML IJ SOLN
INTRAMUSCULAR | Status: DC | PRN
  Administered 2023-05-03: 5 mg via INTRAVENOUS

## 2023-05-03 MED ORDER — MIDAZOLAM HCL 2 MG/2ML IJ SOLN
INTRAMUSCULAR | Status: AC
  Filled 2023-05-03: qty 2

## 2023-05-03 MED ORDER — PROPOFOL 10 MG/ML IV BOLUS
INTRAVENOUS | Status: DC | PRN
  Administered 2023-05-03: 30 mg via INTRAVENOUS
  Administered 2023-05-03: 100 mg via INTRAVENOUS

## 2023-05-03 MED ORDER — MIDAZOLAM HCL 5 MG/5ML IJ SOLN
INTRAMUSCULAR | Status: DC | PRN
  Administered 2023-05-03 (×2): 1 mg via INTRAVENOUS
  Administered 2023-05-03: 2 mg via INTRAVENOUS

## 2023-05-03 MED ORDER — ONDANSETRON HCL 4 MG/2ML IJ SOLN
INTRAMUSCULAR | Status: DC | PRN
  Administered 2023-05-03: 4 mg via INTRAVENOUS

## 2023-05-03 MED ORDER — LACTATED RINGERS IV SOLN
INTRAVENOUS | Status: DC
  Administered 2023-05-03: 1000 mL via INTRAVENOUS

## 2023-05-03 MED ORDER — IOHEXOL 300 MG/ML  SOLN
INTRAMUSCULAR | Status: DC | PRN
  Administered 2023-05-03: 7 mL via URETHRAL

## 2023-05-03 MED ORDER — SODIUM CHLORIDE 0.9 % IR SOLN
Status: DC | PRN
  Administered 2023-05-03: 6000 mL

## 2023-05-03 MED ORDER — DEXAMETHASONE SODIUM PHOSPHATE 10 MG/ML IJ SOLN
INTRAMUSCULAR | Status: AC
  Filled 2023-05-03: qty 1

## 2023-05-03 SURGICAL SUPPLY — 22 items
BAG DRAIN URO-CYSTO SKYTR STRL (DRAIN) ×1 IMPLANT
BAG DRN UROCATH (DRAIN) ×1
BASKET ZERO TIP NITINOL 2.4FR (BASKET) IMPLANT
BSKT STON RTRVL ZERO TP 2.4FR (BASKET) ×1
CATH URETL OPEN 5X70 (CATHETERS) ×1 IMPLANT
CLOTH BEACON ORANGE TIMEOUT ST (SAFETY) ×1 IMPLANT
COVER DOME SNAP 22 D (MISCELLANEOUS) IMPLANT
GLOVE BIO SURGEON STRL SZ 6.5 (GLOVE) ×1 IMPLANT
GLOVE BIO SURGEON STRL SZ7 (GLOVE) IMPLANT
GLOVE BIOGEL PI IND STRL 7.0 (GLOVE) IMPLANT
GOWN STRL REUS W/TWL LRG LVL3 (GOWN DISPOSABLE) ×1 IMPLANT
GOWN STRL REUS W/TWL XL LVL3 (GOWN DISPOSABLE) IMPLANT
GUIDEWIRE STR DUAL SENSOR (WIRE) ×1 IMPLANT
IV NS IRRIG 3000ML ARTHROMATIC (IV SOLUTION) ×1 IMPLANT
KIT TURNOVER CYSTO (KITS) ×1 IMPLANT
MANIFOLD NEPTUNE II (INSTRUMENTS) ×1 IMPLANT
PACK CYSTO (CUSTOM PROCEDURE TRAY) ×1 IMPLANT
SLEEVE SCD COMPRESS KNEE MED (STOCKING) ×1 IMPLANT
STENT URET 6FRX28 CONTOUR (STENTS) IMPLANT
TRACTIP FLEXIVA PULS ID 200XHI (Laser) IMPLANT
TRACTIP FLEXIVA PULSE ID 200 (Laser) ×1
TUBE CONNECTING 12X1/4 (SUCTIONS) ×1 IMPLANT

## 2023-05-03 NOTE — Op Note (Addendum)
Preoperative diagnosis: left ureteral calculus  Postoperative diagnosis: left ureteral calculus  Procedure:  Cystoscopy left ureteroscopy, laser lithotripsy, basket stone extraction left 4F x 28cm ureteral stent placement - without tether left retrograde pyelography with interpretation  Surgeon: Kasandra Knudsen, MD  Anesthesia: General  Complications: None  Intraoperative findings:  Normal urethra Bilateral lobe hypertrophy prostatic urethra Bilateral orthotropic ureteral orifices left retrograde pyelography demonstrated a filling defect within the left ureter consistent with the patient's known calculus without other abnormalities. Bladder mucosa normal without masses  Distal impacted left ureteral calculus  EBL: Minimal  Specimen: none  Disposition of specimens: [facility] for stone analysis  Indication: Manuel Gomez is a 52 y.o.   patient with a 8mm left ureteral stone that failed ESWL. After reviewing the management options for treatment, the patient elected to proceed with the above surgical procedure(s). We have discussed the potential benefits and risks of the procedure, side effects of the proposed treatment, the likelihood of the patient achieving the goals of the procedure, and any potential problems that might occur during the procedure or recuperation. Informed consent has been obtained.   Description of procedure:  The patient was taken to the operating room and general anesthesia was induced.  The patient was placed in the dorsal lithotomy position, prepped and draped in the usual sterile fashion, and preoperative antibiotics were administered. A preoperative time-out was performed.   Cystourethroscopy was performed.  The patient's urethra was examined and demonstrated bilobar prostatic hypertrophy. The bladder was then systematically examined in its entirety. There was no evidence for any bladder tumors, stones, or other mucosal pathology.     Attention then turned to the left ureteral orifice and a ureteral catheter was used to intubate the ureteral orifice.  Omnipaque contrast was injected through the ureteral catheter and a retrograde pyelogram was performed with findings as dictated above.  A 0.38 sensor guidewire was then advanced up the left ureter into the renal pelvis under fluoroscopic guidance. The 4.5Fr semirigid ureteroscope was then advanced into the ureter next to the guidewire and the calculus was identified in the distal ureter. It appeared to be impacted.   The stone was then dusted with the 242 micron holmium laser fiber.  Some of the smaller pieces were coaxed out of the ureteral orifice with 0 tip basket. Reinspection of the ureter revealed no remaining visible stones or fragments.   The wire was then backloaded through the cystoscope and a ureteral stent was advance over the wire using Seldinger technique.  The stent was positioned appropriately under fluoroscopic and cystoscopic guidance.  The wire was then removed with an adequate stent curl noted in the renal pelvis as well as in the bladder.  The bladder was then emptied and the procedure ended.  The patient appeared to tolerate the procedure well and without complications.  The patient was able to be awakened and transferred to the recovery unit in satisfactory condition.   Disposition: No tether was left on stent. Leave stent in place for 10-14 days due to stone being impacted.

## 2023-05-03 NOTE — Anesthesia Procedure Notes (Signed)
Procedure Name: LMA Insertion Date/Time: 05/03/2023 7:47 AM  Performed by: Jessica Priest, CRNAPre-anesthesia Checklist: Timeout performed and Patient being monitored Patient Re-evaluated:Patient Re-evaluated prior to induction Oxygen Delivery Method: Circle system utilized Preoxygenation: Pre-oxygenation with 100% oxygen Induction Type: IV induction Ventilation: Mask ventilation without difficulty LMA: LMA inserted LMA Size: 5.0 Number of attempts: 1 Airway Equipment and Method: Bite block Placement Confirmation: positive ETCO2, breath sounds checked- equal and bilateral and CO2 detector Tube secured with: Tape Dental Injury: Teeth and Oropharynx as per pre-operative assessment  Comments: Upper teeth poor condition - as preop after LMA placed

## 2023-05-03 NOTE — Anesthesia Postprocedure Evaluation (Signed)
Anesthesia Post Note  Patient: Manuel Gomez  Procedure(s) Performed: CYSTOSCOPY, LEFT URETEROSCOPY, LEFT RETROGRADE PYELOGRAM, HOLMIUM LASER LITHOTRIPSY, AND LEFT URETERAL STENT PLACEMENT (Left: Ureter)     Patient location during evaluation: PACU Anesthesia Type: General Level of consciousness: awake and alert Pain management: pain level controlled Vital Signs Assessment: post-procedure vital signs reviewed and stable Respiratory status: spontaneous breathing, nonlabored ventilation, respiratory function stable and patient connected to nasal cannula oxygen Cardiovascular status: blood pressure returned to baseline and stable Postop Assessment: no apparent nausea or vomiting Anesthetic complications: no  No notable events documented.  Last Vitals:  Vitals:   05/03/23 0915 05/03/23 0945  BP: 90/65 91/68  Pulse: (!) 59 (!) 53  Resp: (!) 23 16  Temp: (!) 36.3 C (!) 36.3 C  SpO2: 95% 93%    Last Pain:  Vitals:   05/03/23 0945  TempSrc:   PainSc: 0-No pain                 Leldon Steege L Ketih Goodie

## 2023-05-03 NOTE — Discharge Instructions (Addendum)
DISCHARGE INSTRUCTIONS FOR KIDNEY STONE/URETERAL STENT   MEDICATIONS:  1. Resume all your other meds from home  2. AZO over the counter can help with the burning/stinging when you urinate. 3. Oxycodone is for moderate/severe pain, otherwise taking up to 1000 mg every 6 hours of plainTylenol will help treat your pain.   4. Tamsulosin can help with stent discomfort.   ACTIVITY:  1. No strenuous activity x 1week  2. No driving while on narcotic pain medications  3. Drink plenty of water  4. Continue to walk at home - you can still get blood clots when you are at home, so keep active, but don't over do it.  5. May return to work/school tomorrow or when you feel ready   BATHING:  1. You can shower and we recommend daily showers   SIGNS/SYMPTOMS TO CALL:  Please call us if you have a fever greater than 101.5, uncontrolled nausea/vomiting, uncontrolled pain, dizziness, unable to urinate, bloody urine, chest pain, shortness of breath, leg swelling, leg pain, redness around wound, drainage from wound, or any other concerns or questions.   You can reach [place] at [phone].   FOLLOW-UP:  1.  The stent will be removed in the office in 10 to 14 days.   Post Anesthesia Home Care Instructions  Activity: Get plenty of rest for the remainder of the day. A responsible individual must stay with you for 24 hours following the procedure.  For the next 24 hours, DO NOT: -Drive a car -Advertising copywriter -Drink alcoholic beverages -Take any medication unless instructed by your physician -Make any legal decisions or sign important papers.  Meals: Start with liquid foods such as gelatin or soup. Progress to regular foods as tolerated. Avoid greasy, spicy, heavy foods. If nausea and/or vomiting occur, drink only clear liquids until the nausea and/or vomiting subsides. Call your physician if vomiting continues.  Special Instructions/Symptoms: Your throat may feel dry or sore from the anesthesia or the  breathing tube placed in your throat during surgery. If this causes discomfort, gargle with warm salt water. The discomfort should disappear within 24 hours.  No acetaminophen/Tylenol until after 1:00 pm today if needed.

## 2023-05-03 NOTE — Anesthesia Preprocedure Evaluation (Addendum)
Anesthesia Evaluation  Patient identified by MRN, date of birth, ID band Patient awake    Reviewed: Allergy & Precautions, NPO status , Patient's Chart, lab work & pertinent test results  Airway Mallampati: II  TM Distance: >3 FB Neck ROM: Full  Mouth opening: Limited Mouth Opening  Dental  (+) Chipped, Dental Advisory Given,    Pulmonary former smoker   Pulmonary exam normal breath sounds clear to auscultation       Cardiovascular negative cardio ROS Normal cardiovascular exam Rhythm:Regular Rate:Normal     Neuro/Psych  PSYCHIATRIC DISORDERS Anxiety     negative neurological ROS     GI/Hepatic Neg liver ROS,GERD  ,,  Endo/Other  negative endocrine ROS    Renal/GU negative Renal ROS  negative genitourinary   Musculoskeletal negative musculoskeletal ROS (+)    Abdominal   Peds  Hematology negative hematology ROS (+)   Anesthesia Other Findings   Reproductive/Obstetrics                             Anesthesia Physical Anesthesia Plan  ASA: 2  Anesthesia Plan: General   Post-op Pain Management: Tylenol PO (pre-op)*   Induction: Intravenous  PONV Risk Score and Plan: 2 and Ondansetron, Dexamethasone and Midazolam  Airway Management Planned: LMA  Additional Equipment:   Intra-op Plan:   Post-operative Plan: Extubation in OR  Informed Consent: I have reviewed the patients History and Physical, chart, labs and discussed the procedure including the risks, benefits and alternatives for the proposed anesthesia with the patient or authorized representative who has indicated his/her understanding and acceptance.     Dental advisory given  Plan Discussed with: CRNA  Anesthesia Plan Comments:        Anesthesia Quick Evaluation

## 2023-05-03 NOTE — Transfer of Care (Signed)
Immediate Anesthesia Transfer of Care Note  Patient: Manuel Gomez  Procedure(s) Performed: Procedure(s) (LRB): CYSTOSCOPY, LEFT URETEROSCOPY, LEFT RETROGRADE PYELOGRAM, HOLMIUM LASER LITHOTRIPSY, AND LEFT URETERAL STENT PLACEMENT (Left)  Patient Location: PACU  Anesthesia Type: General  Level of Consciousness: awake, sedated, patient cooperative and responds to stimulation  Airway & Oxygen Therapy: Patient Spontanous Breathing and Patient connected to [place] oxygen  Post-op Assessment: Report given to PACU RN, Post -op Vital signs reviewed and stable and Patient moving all extremities  Post vital signs: Reviewed and stable  Complications: No apparent anesthesia complications

## 2023-05-03 NOTE — H&P (View-Only) (Signed)
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  Stage: pT1  Treatment: Observation.   Manuel Gomez now comes in with 2-month history of red patch on penile glans. It is not painful and has somewhat decreased in size over the last 2 months. Manuel Gomez denies any bleeding, ulceration, change in lower urinary tract symptoms or new sexual partners. Manuel Gomez did see his PCP regarding this. Patient was last seen in [facility] in [facility]. At that time Manuel Gomez was 3 years from orchiectomy. Manuel Gomez has not had a recent surveillance CT scan.   01/06/2023: severe right-sided pain and discomfort with associated nausea. Manuel Gomez had a CT of the abdomen and pelvis ordered by his primary care provider and this was done at an outside facility in [place] [facility]. Per report - showed an obstructing 4 mm RIGHT proximal ureteral calculi. There was also a 9 mm LEFT upper pole nonobstructing calculi. Manuel Gomez was started on tamsulosin and also prescribed Percocet for management of his pain. Manuel Gomez cannot tolerate Percocet endorsing a buzzing and itching sensation over his entire body. Manuel Gomez has been trying to manage his pain with Tylenol. Manuel Gomez has had a couple prior stone events but historically has been able to pass them, Manuel Gomez has never required surgical intervention for an obstructing stone.   03/16/2023: added on today for . Manuel Gomez did not f/u in Feb 2024 for right renal US as the KUB was equivocal. Manuel Gomez reports seeing a stone pass in Feb 2024. About two days ago Manuel Gomez developed LEFT flank pain. Manuel Gomez is staying hydrated with a large bottle of water. Tyenol helps. No fever. No gross hematuria. UA today with 20-40 rbcs. CT images from Jan 2024 reviewed. Declined toradol.   KUB today with an 8 mm left distal stone. Appears the stone that was in his left kidney is now passed  and is in the left distal ureter when looking back at his CT.   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   04/04/2023:  Patient presents for 2-week follow-up status post ESWL for 8 mm left distal stone. Manuel Gomez is on no medications by urology at this time. Manuel Gomez is intolerant of tamsulosin. Since last seen, patient has passed a few stone flakes, which Manuel Gomez brings in with him. However, Manuel Gomez continues with mildly gnawing left flank pain. Manuel Gomez further endorses 1 week of left-sided mild flank pain, reminiscent of stone pain. Manuel Gomez does experience suprapubic pressure with his sense of left inguinal "pulling", with sitting. Manuel Gomez does maintain baseline voiding function, without concern. Manuel Gomez denies dysuria, gross hematuria, fever/chills, nausea/vomiting. Of note, Manuel Gomez did also experience 1 episode of hematospermia status post lithotripsy, which has not recurred.   04/26/2023: Manuel Gomez is a 51-year-old man who presents today with concerns of right-sided flank pain and discomfort. Manuel Gomez is also having severe left-sided flank pain. Manuel Gomez has an obstructing left-sided distal ureteral stone and is scheduled for ureteroscopy on 05/03/2023. Manuel Gomez presents today to ensure Manuel Gomez does not have a right-sided obstruction as well. Manuel Gomez has been having some increased nausea but denies fevers, chills. Manuel Gomez is urinating frequently and also noticing blood in his urine.     ALLERGIES: Bactrim TABS Ciprofloxacin HCl TABS sulfamethoxazole-trimethoprim Tetracycline    MEDICATIONS: Ketorolac Tromethamine 10 mg tablet 1 tablet PO Q 8 H PRN For severe pain  Tamsulosin Hcl 0.4 mg   capsule  Hydrocodone-Acetaminophen 5 mg-325 mg tablet 1 tablet PO Q 6 H PRN     GU PSH: ESWL - 03/21/2023 Locm 300-399Mg/Ml Iodine,1Ml - 2017 Radical Orchiectomy - 2016     NON-GU PSH: No Non-GU PSH    GU PMH: Flank Pain - 04/04/2023 LLQ pain - 04/04/2023 RLQ pain - 04/04/2023 Ureteral calculus - 04/04/2023, (Stable), I discussed with the patient the nature risks and  benefits of continued stone passage, off label use of alpha blockers, shockwave lithotripsy or ureteroscopy. All questions answered. We discussed chances of success and need for staged procedures. Discussed a colleague would be doing the case., - 03/16/2023, - 01/06/2023 Microscopic hematuria, cover with abx as a precaution - 03/16/2023 Renal calculus, Left, Stone removed to the left ureter. - 03/16/2023, Left, - 2019 Disorder of Penis Ot - 01/08/2022 History of testicular cancer, Left - 01/08/2022, (Stable), Left, Manuel Gomez had a pure seminoma with normal tumor markers. I will obtain his annual CT scan. Tumor markers are not indicated in this situation. I will continue to see him on a yearly basis for CT scan., - 2019 Hemorrhoids, Unspec - 2018 Primary hypogonadism (Stable) - 2017 Testicular atrophy, Atrophic testicle - 2016 Chronic prostatitis, Prostatitis, chronic - 2016 Testicular Cancer, Unspec, Seminoma of testis, unspecified laterality - 2016 Other specified disorders of male genital organs, Mass of left testis - 2016      PMH Notes: Calculus disease: Bilateral, nonobstructing stones were noted on CT scan in 2/16.  A CT scan in 2/19 revealed a single left renal calculus only.   Hypogonadism: After the removal of his left testicle with Leydig cell hyperplasia is serum testosterone levels decreased and in 9/16 had fallen to 267. Manuel Gomez continued to maintain good erectile quality and was minimally symptomatic. Manuel Gomez elected not to undergo treatment  Cancer, kidney stones.      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 Smokes 1 pack per day.  Does drink.  Drinks 4+ caffeinated drinks per day. 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     Notes: 1 daughter   REVIEW OF SYSTEMS:    GU Review Male:   Patient reports frequent urination and hard to postpone urination. Patient denies burning/ pain with urination, get up at night to urinate, leakage of urine, stream starts and stops, trouble starting your stream, have to strain to urinate , erection problems, and penile pain.  Gastrointestinal (Upper):   Patient denies nausea, vomiting, and indigestion/ heartburn.  Gastrointestinal (Lower):   Patient denies diarrhea and constipation.  Constitutional:   Patient denies fever, night sweats, weight loss, and fatigue.  Skin:   Patient denies skin rash/ lesion and itching.  Eyes:   Patient denies blurred vision and double vision.  Musculoskeletal:   Patient reports back pain. Patient denies joint pain.  Neurological:   Patient denies headaches and dizziness.   VITAL SIGNS:      04/26/2023 01:20 PM  Weight 187 lb / 84.82 kg  Height 73 in / 185.42 cm  BP 132/83 mmHg  Pulse 77 /min  Temperature 97.3 F / 36.2 C  BMI 24.7 kg/m   GU PHYSICAL   EXAMINATION:      Notes: Left CVA tenderness: No right CVA tenderness     Complexity of Data:  Source Of History:  Patient  Records Review:   Previous Doctor Records, Previous Patient Records  Urine Test Review:   Urinalysis  X-Ray Review: Renal Ultrasound (Limited): Reviewed Films. Reviewed Report. Discussed With Patient.     07/30/16  PSA  Total PSA 2.9 ng/dl    07/30/16 07/17/15 07/03/15 06/25/15 01/10/15 12/11/14  Hormones  Testosterone, Total 316.8 pg/dL 436  286  267  310  410     04/26/23  Urinalysis  Urine Appearance Cloudy   Urine Color Yellow   Urine Glucose Neg mg/dL  Urine Bilirubin Neg mg/dL  Urine Ketones Neg mg/dL  Urine Specific Gravity 1.025   Urine Blood 3+ ery/uL  Urine pH <=5.0   Urine Protein  Neg mg/dL  Urine Urobilinogen 0.2 mg/dL  Urine Nitrites Neg   Urine Leukocyte Esterase Neg leu/uL  Urine WBC/hpf NS (Not Seen)   Urine RBC/hpf 20 - 40/hpf   Urine Epithelial Cells 0 - 5/hpf   Urine Bacteria Mod (26-50/hpf)   Urine Mucous Present   Urine Yeast NS (Not Seen)   Urine Trichomonas Not Present   Urine Cystals NS (Not Seen)   Urine Casts NS (Not Seen)   Urine Sperm Not Present    PROCEDURES:         Renal Ultrasound (Limited) - 76775  Kidney:Right Length: 10.0cm Depth: 5.1cm Cortical Width: 1.5 cm Width:5.1 cm    Right Kidney/Ureter:  appears within normal limits  Bladder:  PVR= 29.1ml      . Patient confirmed No Neulasta OnPro Device.           Urinalysis w/Scope Dipstick Dipstick Cont'd Micro  Color: Yellow Bilirubin: Neg mg/dL WBC/hpf: NS (Not Seen)  Appearance: Cloudy Ketones: Neg mg/dL RBC/hpf: 20 - 40/hpf  Specific Gravity: 1.025 Blood: 3+ ery/uL Bacteria: Mod (26-50/hpf)  pH: <=5.0 Protein: Neg mg/dL Cystals: NS (Not Seen)  Glucose: Neg mg/dL Urobilinogen: 0.2 mg/dL Casts: NS (Not Seen)    Nitrites: Neg Trichomonas: Not Present    Leukocyte Esterase: Neg leu/uL Mucous: Present      Epithelial Cells: 0 - 5/hpf      Yeast: NS (Not Seen)      Sperm: Not Present    ASSESSMENT:      ICD-10 Details  1 GU:   LLQ pain - R10.32 Acute, Uncomplicated  2   Ureteral calculus - N20.1 Acute, Uncomplicated   PLAN:            Medications New Meds: Percocet 5 mg-325 mg tablet 1 tablet PO Q 6 H PRN severe kidney stone pain  #20  0 Refill(s)  Pharmacy Name:  [id number]  Address:  [address]   [place],  [address]  Phone:  [phone]  Fax:  [phone]            Orders Labs CULTURE, URINE  X-Rays: Renal Ultrasound (Limited) - I know Manuel Gomez has a LEFT sided obstruction-Manuel Gomez is now hurting on the righ-Right side please          Schedule Return Visit/Planned Activity: Keep Scheduled Appointment - Schedule Surgery           Document Letter(s):  Created for Patient: Clinical Summary         Notes:   Right renal ultrasound is without obstruction today. Manuel Gomez has surgery scheduled on 05/03/2023 which is in 7 days. I offered   him a dose of Toradol for pain but Manuel Gomez refused. His urine does show bacteria and I am going to send for culture. I did discuss starting empiric antibiotics due to his upcoming procedure and his obstruction but Manuel Gomez refused antibiotics at this time. Manuel Gomez would prefer to wait for his urine culture to return prior to initiating antimicrobial therapy.. Manuel Gomez did however need more pain medication which was sent to his pharmacy. Strict return precautions advised in the interval. Manuel Gomez voiced his understanding.    

## 2023-05-03 NOTE — Interval H&P Note (Signed)
History and Physical Interval Note:  05/03/2023 7:21 AM  Manuel Gomez  has presented today for surgery, with the diagnosis of LEFT URETERAL CALCULUS.  The various methods of treatment have been discussed with the patient and family. After consideration of risks, benefits and other options for treatment, the patient has consented to  Procedure(s) with comments: CYSTOSCOPY, LEFT URETEROSCOPY, LEFT RETROGRADE PYELOGRAM, HOLMIUM LASER LITHOTRIPSY, AND LEFT URETERAL STENT PLACEMENT (Left) - 60 MINUTES as a surgical intervention.  The patient's history has been reviewed, patient examined, no change in status, stable for surgery.  I have reviewed the patient's chart and labs.  Questions were answered to the patient's satisfaction.     Adrie Picking D Qiana Landgrebe

## 2023-05-03 NOTE — H&P (Signed)
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  Stage: pT1  Treatment: Observation.   He now comes in with 26-month history of red patch on penile glans. It is not painful and has somewhat decreased in size over the last 2 months. He denies any bleeding, ulceration, change in lower urinary tract symptoms or new sexual partners. He did see his PCP regarding this. Patient was last seen in [facility] in 2019. At that time he was 3 years from orchiectomy. He has not had a recent surveillance CT scan.   01/06/2023: severe right-sided pain and discomfort with associated nausea. He had a CT of the abdomen and pelvis ordered by his primary care provider and this was done at an outside facility in [place] [facility]. Per report - showed an obstructing 4 mm RIGHT proximal ureteral calculi. There was also a 9 mm LEFT upper pole nonobstructing calculi. He was started on tamsulosin and also prescribed Percocet for management of his pain. He cannot tolerate Percocet endorsing a buzzing and itching sensation over his entire body. He has been trying to manage his pain with Tylenol. He has had a couple prior stone events but historically has been able to pass them, he has never required surgical intervention for an obstructing stone.   03/16/2023: added on today for . He did not f/u in Feb 2024 for right renal US as the KUB was equivocal. He reports seeing a stone pass in Feb 2024. About two days ago he developed LEFT flank pain. He is staying hydrated with a large bottle of water. Tyenol helps. No fever. No gross hematuria. UA today with 20-40 rbcs. CT images from Jan 2024 reviewed. Declined toradol.   KUB today with an 8 mm left distal stone. Appears the stone that was in his left kidney is now passed  and is in the left distal ureter when looking back at his CT.   He is a Medical illustrator - Camera operator.   04/04/2023:  Patient presents for 2-week follow-up status post ESWL for 8 mm left distal stone. He is on no medications by urology at this time. He is intolerant of tamsulosin. Since last seen, patient has passed a few stone flakes, which he brings in with him. However, he continues with mildly gnawing left flank pain. He further endorses 1 week of left-sided mild flank pain, reminiscent of stone pain. He does experience suprapubic pressure with his sense of left inguinal "pulling", with sitting. He does maintain baseline voiding function, without concern. He denies dysuria, gross hematuria, fever/chills, nausea/vomiting. Of note, he did also experience 1 episode of hematospermia status post lithotripsy, which has not recurred.   04/26/2023: Manuel Gomez is a 52 year old man who presents today with concerns of right-sided flank pain and discomfort. He is also having severe left-sided flank pain. He has an obstructing left-sided distal ureteral stone and is scheduled for ureteroscopy on 05/03/2023. He presents today to ensure he does not have a right-sided obstruction as well. He has been having some increased nausea but denies fevers, chills. He is urinating frequently and also noticing blood in his urine.     ALLERGIES: Bactrim TABS Ciprofloxacin HCl TABS sulfamethoxazole-trimethoprim Tetracycline    MEDICATIONS: Ketorolac Tromethamine 10 mg tablet 1 tablet PO Q 8 H PRN For severe pain  Tamsulosin Hcl 0.4 mg  capsule  Hydrocodone-Acetaminophen 5 mg-325 mg tablet 1 tablet PO Q 6 H PRN     GU PSH: ESWL - 03/21/2023 Locm 300-399Mg /Ml Iodine,1Ml - 2017 Radical Orchiectomy - 2016     NON-GU PSH: No Non-GU PSH    GU PMH: Flank Pain - 04/04/2023 LLQ pain - 04/04/2023 RLQ pain - 04/04/2023 Ureteral calculus - 04/04/2023, (Stable), I discussed with the patient the nature risks and  benefits of continued stone passage, off label use of alpha blockers, shockwave lithotripsy or ureteroscopy. All questions answered. We discussed chances of success and need for staged procedures. Discussed a colleague would be doing the case., - 03/16/2023, - 01/06/2023 Microscopic hematuria, cover with abx as a precaution - 03/16/2023 Renal calculus, Left, Stone removed to the left ureter. - 03/16/2023, Left, - 2019 Disorder of Penis Ot - 01/08/2022 History of testicular cancer, Left - 01/08/2022, (Stable), Left, He had a pure seminoma with normal tumor markers. I will obtain his annual CT scan. Tumor markers are not indicated in this situation. I will continue to see him on a yearly basis for CT scan., - 2019 Hemorrhoids, Unspec - 2018 Primary hypogonadism (Stable) - 2017 Testicular atrophy, Atrophic testicle - 2016 Chronic prostatitis, Prostatitis, chronic - 2016 Testicular Cancer, Unspec, Seminoma of testis, unspecified laterality - 2016 Other specified disorders of male genital organs, Mass of left testis - 2016      PMH Notes: Calculus disease: Bilateral, nonobstructing stones were noted on CT scan in 2/16.  A CT scan in 2/19 revealed a single left renal calculus only.   Hypogonadism: After the removal of his left testicle with Leydig cell hyperplasia is serum testosterone levels decreased and in 9/16 had fallen to 267. He continued to maintain good erectile quality and was minimally symptomatic. He elected not to undergo treatment  Cancer, kidney stones.      NON-GU PMH: Chromosomal abnormality, unspecified - 2017 Karyotype 11, XYY - 2017 Anxiety, Anxiety - 2016 Encounter for general adult medical examination without abnormal findings, Encounter for preventive health examination - 2016    FAMILY HISTORY: Kidney Stones - Grandfather No pertinent family history - Runs In Family   SOCIAL HISTORY: Marital Status: Divorced Preferred Language: English; Ethnicity: Not Hispanic Or Latino; Race:  White Current Smoking Status: Patient smokes. Smokes 1 pack per day.  Does drink.  Drinks 4+ caffeinated drinks per day. Patient's occupation Location manager.     Notes: 1 daughter   REVIEW OF SYSTEMS:    GU Review Male:   Patient reports frequent urination and hard to postpone urination. Patient denies burning/ pain with urination, get up at night to urinate, leakage of urine, stream starts and stops, trouble starting your stream, have to strain to urinate , erection problems, and penile pain.  Gastrointestinal (Upper):   Patient denies nausea, vomiting, and indigestion/ heartburn.  Gastrointestinal (Lower):   Patient denies diarrhea and constipation.  Constitutional:   Patient denies fever, night sweats, weight loss, and fatigue.  Skin:   Patient denies skin rash/ lesion and itching.  Eyes:   Patient denies blurred vision and double vision.  Musculoskeletal:   Patient reports back pain. Patient denies joint pain.  Neurological:   Patient denies headaches and dizziness.   VITAL SIGNS:      04/26/2023 01:20 PM  Weight 187 lb / 84.82 kg  Height 73 in / 185.42 cm  BP 132/83 mmHg  Pulse 77 /min  Temperature 97.3 F / 36.2 C  BMI 24.7 kg/m   GU PHYSICAL  EXAMINATION:      Notes: Left CVA tenderness: No right CVA tenderness     Complexity of Data:  Source Of History:  Patient  Records Review:   Previous Doctor Records, Previous Patient Records  Urine Test Review:   Urinalysis  X-Ray Review: Renal Ultrasound (Limited): Reviewed Films. Reviewed Report. Discussed With Patient.     07/30/16  PSA  Total PSA 2.9 ng/dl    16/10/96 04/54/09 81/19/14 06/25/15 01/10/15 12/11/14  Hormones  Testosterone, Total 316.8 pg/dL 782  956  213  086  578     04/26/23  Urinalysis  Urine Appearance Cloudy   Urine Color Yellow   Urine Glucose Neg mg/dL  Urine Bilirubin Neg mg/dL  Urine Ketones Neg mg/dL  Urine Specific Gravity 1.025   Urine Blood 3+ ery/uL  Urine pH <=5.0   Urine Protein  Neg mg/dL  Urine Urobilinogen 0.2 mg/dL  Urine Nitrites Neg   Urine Leukocyte Esterase Neg leu/uL  Urine WBC/hpf NS (Not Seen)   Urine RBC/hpf 20 - 40/hpf   Urine Epithelial Cells 0 - 5/hpf   Urine Bacteria Mod (26-50/hpf)   Urine Mucous Present   Urine Yeast NS (Not Seen)   Urine Trichomonas Not Present   Urine Cystals NS (Not Seen)   Urine Casts NS (Not Seen)   Urine Sperm Not Present    PROCEDURES:         Renal Ultrasound (Limited) - 46962  Kidney:Right Length: 10.0cm Depth: 5.1cm Cortical Width: 1.5 cm Width:5.1 cm    Right Kidney/Ureter:  appears within normal limits  Bladder:  PVR= 29.70ml      . Patient confirmed No Neulasta OnPro Device.           Urinalysis w/Scope Dipstick Dipstick Cont'd Micro  Color: Yellow Bilirubin: Neg mg/dL WBC/hpf: NS (Not Seen)  Appearance: Cloudy Ketones: Neg mg/dL RBC/hpf: 20 - 95/MWU  Specific Gravity: 1.025 Blood: 3+ ery/uL Bacteria: Mod (26-50/hpf)  pH: <=5.0 Protein: Neg mg/dL Cystals: NS (Not Seen)  Glucose: Neg mg/dL Urobilinogen: 0.2 mg/dL Casts: NS (Not Seen)    Nitrites: Neg Trichomonas: Not Present    Leukocyte Esterase: Neg leu/uL Mucous: Present      Epithelial Cells: 0 - 5/hpf      Yeast: NS (Not Seen)      Sperm: Not Present    ASSESSMENT:      ICD-10 Details  1 GU:   LLQ pain - R10.32 Acute, Uncomplicated  2   Ureteral calculus - N20.1 Acute, Uncomplicated   PLAN:            Medications New Meds: Percocet 5 mg-325 mg tablet 1 tablet PO Q 6 H PRN severe kidney stone pain  #20  0 Refill(s)  [facility]:  [phone]  Address:  [address]   [place], [place] [address]  Phone:  [phone]  Fax:  [phone]            Orders Labs CULTURE, URINE  X-Rays: Renal Ultrasound (Limited) - I know he has a LEFT sided obstruction-He is now hurting on the righ-Right side please          Schedule Return Visit/Planned Activity: Keep Scheduled Appointment - Schedule Surgery           Document Letter(s):  Created for Patient: Clinical Summary         Notes:   Right renal ultrasound is without obstruction today. He has surgery scheduled on 05/03/2023 which is in 7 days. I offered  him a dose of Toradol for pain but he refused. His urine does show bacteria and I am going to send for culture. I did discuss starting empiric antibiotics due to his upcoming procedure and his obstruction but he refused antibiotics at this time. He would prefer to wait for his urine culture to return prior to initiating antimicrobial therapy.Marland Kitchen He did however need more pain medication which was sent to his pharmacy. Strict return precautions advised in the interval. He voiced his understanding.

## 2023-05-04 ENCOUNTER — Encounter (HOSPITAL_BASED_OUTPATIENT_CLINIC_OR_DEPARTMENT_OTHER): Payer: Self-pay | Admitting: Urology

## 2023-05-25 ENCOUNTER — Encounter (HOSPITAL_BASED_OUTPATIENT_CLINIC_OR_DEPARTMENT_OTHER): Payer: Self-pay | Admitting: Urology

## 2023-05-26 ENCOUNTER — Other Ambulatory Visit: Payer: Self-pay | Admitting: Urology

## 2023-05-26 ENCOUNTER — Encounter (HOSPITAL_BASED_OUTPATIENT_CLINIC_OR_DEPARTMENT_OTHER): Payer: Self-pay | Admitting: Urology

## 2023-05-26 NOTE — Progress Notes (Signed)
Spoke w/ via phone for pre-op interview--- pt Lab needs dos----  no             Lab results------ no COVID test -----patient states asymptomatic no test needed Arrive at ------- 0600 on 05-31-2023 NPO after MN NO Solid Food.  Clear liquids from MN until--- 0500 Med rec completed Medications to take morning of surgery ----- none Diabetic medication ----- n/a Patient instructed no nail polish to be worn day of surgery Patient instructed to bring photo id and insurance card day of surgery Patient aware to have Driver (ride ) / caregiver    for 24 hours after surgery -- Pt stated since surgery just added on does note have anyone yet but pt verbalized understanding to have name/ number of driver and caregiver when checks in [facility] Patient Special Instructions ----- n/a Pre-Op special Instructions ----- n/a Patient verbalized understanding of instructions that were given at this phone interview. Patient denies shortness of breath, chest pain, fever, cough at this phone interview.

## 2023-05-31 ENCOUNTER — Encounter (HOSPITAL_BASED_OUTPATIENT_CLINIC_OR_DEPARTMENT_OTHER): Payer: Self-pay | Admitting: Urology

## 2023-05-31 ENCOUNTER — Ambulatory Visit (HOSPITAL_BASED_OUTPATIENT_CLINIC_OR_DEPARTMENT_OTHER)
Admission: RE | Admit: 2023-05-31 | Discharge: 2023-05-31 | Disposition: A | Discharge status: Home / Self Care | Payer: BC Managed Care – PPO | Source: Ambulatory Visit | Attending: Urology | Admitting: Urology

## 2023-05-31 ENCOUNTER — Ambulatory Visit (HOSPITAL_BASED_OUTPATIENT_CLINIC_OR_DEPARTMENT_OTHER): Payer: BC Managed Care – PPO | Admitting: Anesthesiology

## 2023-05-31 ENCOUNTER — Encounter (HOSPITAL_BASED_OUTPATIENT_CLINIC_OR_DEPARTMENT_OTHER)
Admission: RE | Disposition: A | Discharge status: Home / Self Care | Payer: Self-pay | Source: Ambulatory Visit | Attending: Urology

## 2023-05-31 DIAGNOSIS — N201 Calculus of ureter: Secondary | ICD-10-CM | POA: Diagnosis present

## 2023-05-31 DIAGNOSIS — Z9079 Acquired absence of other genital organ(s): Secondary | ICD-10-CM | POA: Diagnosis not present

## 2023-05-31 DIAGNOSIS — Z8547 Personal history of malignant neoplasm of testis: Secondary | ICD-10-CM | POA: Diagnosis not present

## 2023-05-31 DIAGNOSIS — Q985 Karyotype 47, XYY: Secondary | ICD-10-CM | POA: Diagnosis not present

## 2023-05-31 DIAGNOSIS — F17210 Nicotine dependence, cigarettes, uncomplicated: Secondary | ICD-10-CM | POA: Diagnosis not present

## 2023-05-31 DIAGNOSIS — Z08 Encounter for follow-up examination after completed treatment for malignant neoplasm: Secondary | ICD-10-CM | POA: Diagnosis not present

## 2023-05-31 DIAGNOSIS — Z01818 Encounter for other preprocedural examination: Secondary | ICD-10-CM

## 2023-05-31 HISTORY — PX: CYSTOSCOPY WITH STENT PLACEMENT: SHX5790

## 2023-05-31 SURGERY — CYSTOSCOPY, WITH STENT INSERTION
Anesthesia: General | Site: Ureter | Laterality: Left

## 2023-05-31 MED ORDER — KETOROLAC TROMETHAMINE 30 MG/ML IJ SOLN
INTRAMUSCULAR | Status: AC
  Filled 2023-05-31: qty 1

## 2023-05-31 MED ORDER — PHENYLEPHRINE 80 MCG/ML (10ML) SYRINGE FOR IV PUSH (FOR BLOOD PRESSURE SUPPORT)
PREFILLED_SYRINGE | INTRAVENOUS | Status: AC
  Filled 2023-05-31: qty 10

## 2023-05-31 MED ORDER — LIDOCAINE HCL (PF) 2 % IJ SOLN
INTRAMUSCULAR | Status: AC
  Filled 2023-05-31: qty 5

## 2023-05-31 MED ORDER — LACTATED RINGERS IV SOLN: INTRAVENOUS | Status: DC

## 2023-05-31 MED ORDER — KETOROLAC TROMETHAMINE 30 MG/ML IJ SOLN
INTRAMUSCULAR | Status: DC | PRN
  Administered 2023-05-31: 30 mg via INTRAVENOUS

## 2023-05-31 MED ORDER — FENTANYL CITRATE (PF) 100 MCG/2ML IJ SOLN
INTRAMUSCULAR | Status: DC | PRN
  Administered 2023-05-31: 50 ug via INTRAVENOUS

## 2023-05-31 MED ORDER — PROPOFOL 10 MG/ML IV BOLUS
INTRAVENOUS | Status: DC | PRN
  Administered 2023-05-31: 200 mg via INTRAVENOUS

## 2023-05-31 MED ORDER — ONDANSETRON HCL 4 MG/2ML IJ SOLN
INTRAMUSCULAR | Status: AC
  Filled 2023-05-31: qty 2

## 2023-05-31 MED ORDER — CEFAZOLIN SODIUM-DEXTROSE 2-4 GM/100ML-% IV SOLN
INTRAVENOUS | Status: AC
  Filled 2023-05-31: qty 100

## 2023-05-31 MED ORDER — ACETAMINOPHEN 500 MG PO TABS
ORAL_TABLET | ORAL | Status: AC
  Filled 2023-05-31: qty 2

## 2023-05-31 MED ORDER — DEXAMETHASONE SODIUM PHOSPHATE 10 MG/ML IJ SOLN
INTRAMUSCULAR | Status: AC
  Filled 2023-05-31: qty 1

## 2023-05-31 MED ORDER — PROPOFOL 10 MG/ML IV BOLUS
INTRAVENOUS | Status: AC
  Filled 2023-05-31: qty 20

## 2023-05-31 MED ORDER — WHITE PETROLATUM EX OINT
TOPICAL_OINTMENT | CUTANEOUS | Status: AC
  Filled 2023-05-31: qty 5

## 2023-05-31 MED ORDER — PHENYLEPHRINE 80 MCG/ML (10ML) SYRINGE FOR IV PUSH (FOR BLOOD PRESSURE SUPPORT)
PREFILLED_SYRINGE | INTRAVENOUS | Status: DC | PRN
  Administered 2023-05-31 (×2): 80 ug via INTRAVENOUS

## 2023-05-31 MED ORDER — MIDAZOLAM HCL 2 MG/2ML IJ SOLN
INTRAMUSCULAR | Status: AC
  Filled 2023-05-31: qty 2

## 2023-05-31 MED ORDER — FENTANYL CITRATE (PF) 100 MCG/2ML IJ SOLN
INTRAMUSCULAR | Status: AC
  Filled 2023-05-31: qty 2

## 2023-05-31 MED ORDER — ACETAMINOPHEN 500 MG PO TABS
1000.0000 mg | ORAL_TABLET | Freq: Once | ORAL | Status: AC
  Administered 2023-05-31: 1000 mg via ORAL

## 2023-05-31 MED ORDER — MIDAZOLAM HCL 2 MG/2ML IJ SOLN
INTRAMUSCULAR | Status: DC | PRN
  Administered 2023-05-31: 2 mg via INTRAVENOUS

## 2023-05-31 MED ORDER — STERILE WATER FOR IRRIGATION IR SOLN
Status: DC | PRN
  Administered 2023-05-31: 3000 mL

## 2023-05-31 MED ORDER — FENTANYL CITRATE (PF) 100 MCG/2ML IJ SOLN: 25.0000 ug | INTRAMUSCULAR | Status: DC | PRN

## 2023-05-31 MED ORDER — DEXAMETHASONE SODIUM PHOSPHATE 10 MG/ML IJ SOLN
INTRAMUSCULAR | Status: DC | PRN
  Administered 2023-05-31: 10 mg via INTRAVENOUS

## 2023-05-31 MED ORDER — CEFAZOLIN SODIUM-DEXTROSE 2-4 GM/100ML-% IV SOLN: 2.0000 g | INTRAVENOUS | Status: DC

## 2023-05-31 MED ORDER — LIDOCAINE 2% (20 MG/ML) 5 ML SYRINGE
INTRAMUSCULAR | Status: DC | PRN
  Administered 2023-05-31: 60 mg via INTRAVENOUS

## 2023-05-31 SURGICAL SUPPLY — 14 items
BAG DRAIN URO-CYSTO SKYTR STRL (DRAIN) ×1 IMPLANT
BAG DRN UROCATH (DRAIN) ×1
CLOTH BEACON ORANGE TIMEOUT ST (SAFETY) ×1 IMPLANT
GLOVE BIO SURGEON STRL SZ 6.5 (GLOVE) ×1 IMPLANT
GLOVE BIOGEL PI IND STRL 7.0 (GLOVE) IMPLANT
GOWN STRL REUS W/ TWL LRG LVL3 (GOWN DISPOSABLE) IMPLANT
GOWN STRL REUS W/TWL LRG LVL3 (GOWN DISPOSABLE) ×2 IMPLANT
GUIDEWIRE STR DUAL SENSOR (WIRE) ×1 IMPLANT
KIT TURNOVER CYSTO (KITS) ×1 IMPLANT
MANIFOLD NEPTUNE II (INSTRUMENTS) ×1 IMPLANT
PACK CYSTO (CUSTOM PROCEDURE TRAY) ×1 IMPLANT
SLEEVE SCD COMPRESS KNEE MED (STOCKING) ×1 IMPLANT
TUBE CONNECTING 12X1/4 (SUCTIONS) ×1 IMPLANT
WATER STERILE IRR 3000ML UROMA (IV SOLUTION) IMPLANT

## 2023-05-31 NOTE — Op Note (Signed)
Operative Note  Preoperative diagnosis:  1.  Left ureteral calculus  Postoperative diagnosis: 1.  Left ureteral calculus  Procedure(s): 1.  Cystoscopy with removal of left ureteral stent   Surgeon: Kasandra Knudsen, MD  Assistants:  None  Anesthesia:  General  Complications:  None  EBL:  minimal  Specimens: 1. none  Drains/Catheters: 1.  none  Intraoperative findings:   Normal anterior urethra Bilateral lateral lobe hyperplasia Left ureteral stent removed Bladder not well visualized due to hemtauria  Indication:  Manuel Gomez is a 52 y.o. male with left ureteral calculus s/p ureteroscopy with laser lithotripsy and stent placement. He was unable to tolerate stent removal in the office.  Description of procedure:  After risks and benefits of procedure were discussed with the patient, informed consent was obtained. He was taken to the operating room and placed in the supine position. Anesthesia was induced and antibiotics were administered. He was then repositioned in the dorsal lithotomy position. He was prepped and draped in the usual sterile fashion and a time out was performed.  A 23 French rigid cystoscope was placed in the urethral meatus and advanced in the bladder and direct visualization.  The left ureteral stent was grabbed with graspers and removed.  The stent was examined and deemed to be intact.    The patient's bladder was decompressed.  He emerged from anesthesia and sent to the PACU in stable condition.   Plan:  Follow up in 6 weeks with renal US

## 2023-05-31 NOTE — Anesthesia Postprocedure Evaluation (Signed)
Anesthesia Post Note  Patient: Manuel Gomez  Procedure(s) Performed: CYSTOSCOPY WITH LEFT URETERAL  STENT REMOVAL (Left: Ureter)     Patient location during evaluation: PACU Anesthesia Type: General Level of consciousness: awake and alert Pain management: pain level controlled Vital Signs Assessment: post-procedure vital signs reviewed and stable Respiratory status: spontaneous breathing, nonlabored ventilation, respiratory function stable and patient connected to nasal cannula oxygen Cardiovascular status: blood pressure returned to baseline and stable Postop Assessment: no apparent nausea or vomiting Anesthetic complications: no  No notable events documented.  Last Vitals:  Vitals:   05/31/23 0845 05/31/23 0910  BP: 111/73 100/70  Pulse: 64 (!) 58  Resp: 13 12  Temp:  36.4 C  SpO2: 95% 98%    Last Pain:  Vitals:   05/31/23 0910  TempSrc:   PainSc: 0-No pain                 Lilith Solana L Raynetta Osterloh

## 2023-05-31 NOTE — Interval H&P Note (Signed)
History and Physical Interval Note: Patient unable to tolerate stent removal in the office.   05/31/2023 7:27 AM  Manuel Gomez  has presented today for surgery, with the diagnosis of LEFT URETERAL STENT.  The various methods of treatment have been discussed with the patient and family. After consideration of risks, benefits and other options for treatment, the patient has consented to  Procedure(s): CYSTOSCOPY WITH LEFT URETERAL  STENT REMOVAL (Left) as a surgical intervention.  The patient's history has been reviewed, patient examined, no change in status, stable for surgery.  I have reviewed the patient's chart and labs.  Questions were answered to the patient's satisfaction.     Suly Vukelich D Verlinda Slotnick

## 2023-05-31 NOTE — Anesthesia Procedure Notes (Signed)
Procedure Name: LMA Insertion Date/Time: 05/31/2023 7:56 AM  Performed by: Francie Massing, CRNAPre-anesthesia Checklist: Patient identified, Emergency Drugs available, Suction available and Patient being monitored Patient Re-evaluated:Patient Re-evaluated prior to induction Oxygen Delivery Method: Circle system utilized Preoxygenation: Pre-oxygenation with 100% oxygen Induction Type: IV induction Ventilation: Mask ventilation without difficulty LMA: LMA inserted LMA Size: 5.0 Number of attempts: 1 Airway Equipment and Method: Bite block Placement Confirmation: positive ETCO2 Tube secured with: Tape Dental Injury: Teeth and Oropharynx as per pre-operative assessment

## 2023-05-31 NOTE — Transfer of Care (Signed)
Immediate Anesthesia Transfer of Care Note  Patient: Manuel Gomez  Procedure(s) Performed: Procedure(s) (LRB): CYSTOSCOPY WITH LEFT URETERAL  STENT REMOVAL (Left)  Patient Location: PACU  Anesthesia Type: General  Level of Consciousness: awake, oriented, sedated and patient cooperative  Airway & Oxygen Therapy: Patient Spontanous Breathing and Patient connected to face mask oxygen  Post-op Assessment: Report given to PACU RN and Post -op Vital signs reviewed and stable  Post vital signs: Reviewed and stable  Complications: No apparent anesthesia complications  Last Vitals:  Vitals Value Taken Time  BP 100/69 05/31/23 0815  Temp    Pulse 75 05/31/23 0816  Resp 13 05/31/23 0816  SpO2 98 % 05/31/23 0816  Vitals shown include unvalidated device data.  Last Pain:  Vitals:   05/31/23 0630  TempSrc: Oral  PainSc: 4       Patients Stated Pain Goal: 3 (05/31/23 0630)  Complications: No notable events documented.

## 2023-05-31 NOTE — Anesthesia Preprocedure Evaluation (Addendum)
Anesthesia Evaluation  Patient identified by MRN, date of birth, ID band Patient awake    Reviewed: Allergy & Precautions, NPO status , Patient's Chart, lab work & pertinent test results  Airway Mallampati: II  TM Distance: >3 FB Neck ROM: Full    Dental  (+) Chipped, Dental Advisory Given,    Pulmonary former smoker   Pulmonary exam normal breath sounds clear to auscultation       Cardiovascular negative cardio ROS Normal cardiovascular exam Rhythm:Regular Rate:Normal     Neuro/Psych  PSYCHIATRIC DISORDERS Anxiety     negative neurological ROS     GI/Hepatic Neg liver ROS,GERD  ,,  Endo/Other  negative endocrine ROS    Renal/GU negative Renal ROS  negative genitourinary   Musculoskeletal negative musculoskeletal ROS (+)    Abdominal   Peds  Hematology negative hematology ROS (+)   Anesthesia Other Findings   Reproductive/Obstetrics                             Anesthesia Physical Anesthesia Plan  ASA: 2  Anesthesia Plan: General   Post-op Pain Management: Tylenol PO (pre-op)*   Induction: Intravenous  PONV Risk Score and Plan: 2 and Ondansetron, Dexamethasone and Midazolam  Airway Management Planned: LMA  Additional Equipment:   Intra-op Plan:   Post-operative Plan: Extubation in OR  Informed Consent: I have reviewed the patients History and Physical, chart, labs and discussed the procedure including the risks, benefits and alternatives for the proposed anesthesia with the patient or authorized representative who has indicated his/her understanding and acceptance.     Dental advisory given  Plan Discussed with: CRNA  Anesthesia Plan Comments:        Anesthesia Quick Evaluation

## 2023-05-31 NOTE — Discharge Instructions (Addendum)
Cystoscopy with stent removal patient instructions  Following a cystoscopy, a catheter (a flexible rubber tube) is sometimes left in place to empty the bladder. This may cause some discomfort or a feeling that you need to urinate. Your doctor determines the period of time that the catheter will be left in place. You may have bloody urine for two to three days (Call your doctor if the amount of bleeding increases or does not subside).  You may pass blood clots in your urine, especially if you had a biopsy. It is not unusual to pass small blood clots and have some bloody urine a couple of weeks after your cystoscopy. Again, call your doctor if the bleeding does not subside. You may have: Dysuria (painful urination) Frequency (urinating often) Urgency (strong desire to urinate)  These symptoms are common especially if medicine is instilled into the bladder or a ureteral stent is placed. Avoiding alcohol and caffeine, such as coffee, tea, and chocolate, may help relieve these symptoms. Drink plenty of water, unless otherwise instructed. Your doctor may also prescribe an antibiotic or other medicine to reduce these symptoms.  Cystoscopy results are available soon after the procedure; biopsy results usually take two to four days. Your doctor will discuss the results of your exam with you. Before you go home, you will be given specific instructions for follow-up care. Special Instructions:   If you are going home with a catheter in place do not take a tub bath until removed by your doctor.   You may resume your normal activities.   Do not drive or operate machinery if you are taking narcotic pain medicine.   Be sure to keep all follow-up appointments with your doctor.   Call Your Doctor If: The catheter is not draining You have severe pain You are unable to urinate You have a fever over 101 You have severe bleeding            Post Anesthesia Home Care Instructions  Activity: Get plenty of  rest for the remainder of the day. A responsible individual must stay with you for 24 hours following the procedure.  For the next 24 hours, DO NOT: -Drive a car -Advertising copywriter -Drink alcoholic beverages -Take any medication unless instructed by your physician -Make any legal decisions or sign important papers.  Meals: Start with liquid foods such as gelatin or soup. Progress to regular foods as tolerated. Avoid greasy, spicy, heavy foods. If nausea and/or vomiting occur, drink only clear liquids until the nausea and/or vomiting subsides. Call your physician if vomiting continues.  Special Instructions/Symptoms: Your throat may feel dry or sore from the anesthesia or the breathing tube placed in your throat during surgery. If this causes discomfort, gargle with warm salt water. The discomfort should disappear within 24 hour.  Begin taking Tylenol at 1 PM as needed for soreness/discomfort.

## 2023-06-01 ENCOUNTER — Encounter (HOSPITAL_BASED_OUTPATIENT_CLINIC_OR_DEPARTMENT_OTHER): Payer: Self-pay | Admitting: Urology

## 2023-09-22 ENCOUNTER — Telehealth: Payer: Self-pay | Admitting: Oncology

## 2023-09-22 NOTE — Telephone Encounter (Signed)
Called and left VM for patient to call back to schedule an appointment.

## 2023-09-23 ENCOUNTER — Telehealth: Payer: Self-pay | Admitting: Hematology and Oncology

## 2023-09-23 NOTE — Telephone Encounter (Signed)
Scheduled appointments per referral. Patient is aware of the appointment time and date as well as the address. Patient was informed to arrive 10-15 minutes prior with updated insurance information. All questions were answered.

## 2023-10-20 ENCOUNTER — Inpatient Hospital Stay: Payer: BC Managed Care – PPO | Attending: Hematology and Oncology

## 2023-10-20 ENCOUNTER — Other Ambulatory Visit: Payer: Self-pay | Admitting: Hematology and Oncology

## 2023-10-20 DIAGNOSIS — Z8547 Personal history of malignant neoplasm of testis: Secondary | ICD-10-CM | POA: Insufficient documentation

## 2023-10-20 DIAGNOSIS — D472 Monoclonal gammopathy: Secondary | ICD-10-CM

## 2023-10-20 DIAGNOSIS — Z832 Family history of diseases of the blood and blood-forming organs and certain disorders involving the immune mechanism: Secondary | ICD-10-CM | POA: Diagnosis not present

## 2023-10-20 DIAGNOSIS — Z87891 Personal history of nicotine dependence: Secondary | ICD-10-CM | POA: Insufficient documentation

## 2023-10-20 DIAGNOSIS — F419 Anxiety disorder, unspecified: Secondary | ICD-10-CM | POA: Diagnosis not present

## 2023-10-20 DIAGNOSIS — Z8616 Personal history of COVID-19: Secondary | ICD-10-CM | POA: Insufficient documentation

## 2023-10-20 DIAGNOSIS — R768 Other specified abnormal immunological findings in serum: Secondary | ICD-10-CM | POA: Diagnosis present

## 2023-10-20 DIAGNOSIS — K219 Gastro-esophageal reflux disease without esophagitis: Secondary | ICD-10-CM | POA: Insufficient documentation

## 2023-10-20 LAB — CBC WITH DIFFERENTIAL (CANCER CENTER ONLY)
Abs Immature Granulocytes: 0.01 10*3/uL (ref 0.00–0.07)
Basophils Absolute: 0 10*3/uL (ref 0.0–0.1)
Basophils Relative: 1 %
Eosinophils Absolute: 0.1 10*3/uL (ref 0.0–0.5)
Eosinophils Relative: 1 %
HCT: 47.5 % (ref 39.0–52.0)
Hemoglobin: 16 g/dL (ref 13.0–17.0)
Immature Granulocytes: 0 %
Lymphocytes Relative: 35 %
Lymphs Abs: 2 10*3/uL (ref 0.7–4.0)
MCH: 30 pg (ref 26.0–34.0)
MCHC: 33.7 g/dL (ref 30.0–36.0)
MCV: 89 fL (ref 80.0–100.0)
Monocytes Absolute: 0.7 10*3/uL (ref 0.1–1.0)
Monocytes Relative: 12 %
Neutro Abs: 2.9 10*3/uL (ref 1.7–7.7)
Neutrophils Relative %: 51 %
Platelet Count: 216 10*3/uL (ref 150–400)
RBC: 5.34 MIL/uL (ref 4.22–5.81)
RDW: 12.7 % (ref 11.5–15.5)
WBC Count: 5.7 10*3/uL (ref 4.0–10.5)
nRBC: 0 % (ref 0.0–0.2)

## 2023-10-20 LAB — CMP (CANCER CENTER ONLY)
ALT: 30 U/L (ref 0–44)
AST: 22 U/L (ref 15–41)
Albumin: 4.7 g/dL (ref 3.5–5.0)
Alkaline Phosphatase: 90 U/L (ref 38–126)
Anion gap: 4 — ABNORMAL LOW (ref 5–15)
BUN: 19 mg/dL (ref 6–20)
CO2: 31 mmol/L (ref 22–32)
Calcium: 9.6 mg/dL (ref 8.9–10.3)
Chloride: 105 mmol/L (ref 98–111)
Creatinine: 0.99 mg/dL (ref 0.61–1.24)
GFR, Estimated: >60 mL/min (ref >60)
Glucose, Bld: 114 mg/dL — ABNORMAL HIGH (ref 70–99)
Potassium: 4.3 mmol/L (ref 3.5–5.1)
Sodium: 140 mmol/L (ref 135–145)
Total Bilirubin: 0.6 mg/dL (ref <1.2)
Total Protein: 8 g/dL (ref 6.5–8.1)

## 2023-10-20 LAB — LACTATE DEHYDROGENASE: LDH: 159 U/L (ref 98–192)

## 2023-10-21 LAB — KAPPA/LAMBDA LIGHT CHAINS
Kappa free light chain: 30.5 mg/L — ABNORMAL HIGH (ref 3.3–19.4)
Kappa, lambda light chain ratio: 2.1 — ABNORMAL HIGH (ref 0.26–1.65)
Lambda free light chains: 14.5 mg/L (ref 5.7–26.3)

## 2023-10-21 LAB — BETA 2 MICROGLOBULIN, SERUM: Beta-2 Microglobulin: 1.4 mg/L (ref 0.6–2.4)

## 2023-10-24 ENCOUNTER — Inpatient Hospital Stay (HOSPITAL_BASED_OUTPATIENT_CLINIC_OR_DEPARTMENT_OTHER): Payer: BC Managed Care – PPO | Admitting: Hematology and Oncology

## 2023-10-24 VITALS — BP 124/84 | HR 67 | Temp 98.2°F | Resp 13 | Wt 189.5 lb

## 2023-10-24 DIAGNOSIS — K219 Gastro-esophageal reflux disease without esophagitis: Secondary | ICD-10-CM | POA: Diagnosis not present

## 2023-10-24 DIAGNOSIS — R768 Other specified abnormal immunological findings in serum: Secondary | ICD-10-CM | POA: Diagnosis not present

## 2023-10-24 DIAGNOSIS — Z8547 Personal history of malignant neoplasm of testis: Secondary | ICD-10-CM

## 2023-10-24 DIAGNOSIS — Z8616 Personal history of COVID-19: Secondary | ICD-10-CM | POA: Diagnosis not present

## 2023-10-24 DIAGNOSIS — F419 Anxiety disorder, unspecified: Secondary | ICD-10-CM

## 2023-10-24 DIAGNOSIS — Z87891 Personal history of nicotine dependence: Secondary | ICD-10-CM

## 2023-10-24 DIAGNOSIS — Z832 Family history of diseases of the blood and blood-forming organs and certain disorders involving the immune mechanism: Secondary | ICD-10-CM

## 2023-10-24 DIAGNOSIS — D472 Monoclonal gammopathy: Secondary | ICD-10-CM

## 2023-10-24 LAB — MULTIPLE MYELOMA PANEL, SERUM
Albumin SerPl Elph-Mcnc: 4.5 g/dL — ABNORMAL HIGH (ref 2.9–4.4)
Albumin/Glob SerPl: 1.5 (ref 0.7–1.7)
Alpha 1: 0.2 g/dL (ref 0.0–0.4)
Alpha2 Glob SerPl Elph-Mcnc: 0.5 g/dL (ref 0.4–1.0)
B-Globulin SerPl Elph-Mcnc: 1 g/dL (ref 0.7–1.3)
Gamma Glob SerPl Elph-Mcnc: 1.4 g/dL (ref 0.4–1.8)
Globulin, Total: 3.1 g/dL (ref 2.2–3.9)
IgA: 202 mg/dL (ref 90–386)
IgG (Immunoglobin G), Serum: 1545 mg/dL (ref 603–1613)
IgM (Immunoglobulin M), Srm: 111 mg/dL (ref 20–172)
Total Protein ELP: 7.6 g/dL (ref 6.0–8.5)

## 2023-10-24 NOTE — Progress Notes (Signed)
[facility] [phone]   [phone]  INITIAL CONSULT NOTE  Patient Care Team: Patient, No Pcp Per as PCP - General (General Practice)  Hematological/Oncological History # Elevated Serum Free Light Chain Ratio 09/08/2023: Kappa 30.2, Lambda 15.4, ratio 1.96. IFE showed polyclonal increase in one or more immunoglobulins.  10/24/2023: establish care with Dr. Leonides Schanz   CHIEF COMPLAINTS/PURPOSE OF CONSULTATION:  "Elevated Serum Free Light Chain Ratio "  HISTORY OF PRESENTING ILLNESS:  Manuel Gomez 52 y.o. male with medical history significant for GERD, anxiety, and hx of testicular cancer who presents for evaluation of abnormal serum free light chain ratio.   On review of the previous records Manuel Gomez had labs drawn on 09/08/2023 which showed a kappa 30.2, lambda 15.4, ratio 1.96.  IFE showed a polyclonal increase in 1 or more immunoglobulins.  Due to concern for these findings the patient was referred to hematology for further evaluation and management.  On exam today Manuel Gomez reports that he has completed his blood work but has not yet returned his urine drug.  He reports that he does have some issues with knee pain thought to be secondary to arthritis.  He does not have any other bone or back pain.  The patient reports that he has had a history of kidney stones and has undergone therapy to help break up the stones.  Otherwise he is had no major changes in his health with no recent fevers, chills, sweats, nausea, vomiting or diarrhea.  On further discussion he reports his family history is remarkable for a mother with anemia who underwent bone marrow biopsy.  He reports his father is healthy and his half brother has schizophrenia/bipolar disorder.  He has 1 daughter who is 40 years old and healthy.  Both paternal grandparents died of lung cancer and his paternal grandfather died of cirrhosis.  He reports his maternal grandmother had CVAs and MIs.  He  is a former smoker having quit 4 years ago.  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  He notes that he had COVID 8 months ago but otherwise has been at his baseline level of health.  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   01-03-2015  s/p left radical orchiectomy for left testis mass;  seminoma w/ leydig cell hyperplasia, Stage pT1   Primary hypogonadism in male 2016   post op left testis removal   Tinnitus of both ears    Wears glasses     SURGICAL HISTORY: Past Surgical History:  Procedure Laterality Date   CYSTOSCOPY WITH STENT PLACEMENT Left 05/31/2023   Procedure: CYSTOSCOPY WITH LEFT URETERAL  STENT REMOVAL;  Surgeon: Noel Christmas, MD;  Location: [facility] ;  Service: Urology;  Laterality: Left;   CYSTOSCOPY/URETEROSCOPY/HOLMIUM LASER/STENT PLACEMENT Left 05/03/2023   Procedure: CYSTOSCOPY, LEFT URETEROSCOPY, LEFT RETROGRADE PYELOGRAM, HOLMIUM LASER LITHOTRIPSY, AND LEFT URETERAL STENT PLACEMENT;  Surgeon: Noel Christmas, MD;  Location: [facility];  Service: Urology;  Laterality: Left;   EXTRACORPOREAL SHOCK WAVE LITHOTRIPSY Left 03/21/2023   Procedure: LEFT EXTRACORPOREAL SHOCK WAVE LITHOTRIPSY (ESWL);  Surgeon: Belva Agee, MD;  Location: [facility];  Service: Urology;  Laterality: Left;   ORCHIECTOMY Left 01/03/2015   Procedure: LEFT RADICAL INGUINAL ORCHIECTOMY;  Surgeon:  Kathi Ludwig, MD;  Location: [facility];  Service: Urology;  Laterality: Left;    SOCIAL HISTORY: Social History   Socioeconomic History   Marital status: Divorced    Spouse name: Not on file   Number of children: Not on file   Years of education: Not on file   Highest education level: Not on file  Occupational  History   Not on file  Tobacco Use   Smoking status: Former    Current packs/day: 0.00    Average packs/day: 0.5 packs/day for 10.0 years (5.0 ttl pk-yrs)    Types: Cigarettes    Start date: 2011    Quit date: 2021    Years since quitting: 3.8   Smokeless tobacco: Never  Vaping Use   Vaping status: Never Used  Substance and Sexual Activity   Alcohol use: Yes    Comment: occasional   Drug use: Never   Sexual activity: Not Currently  Other Topics Concern   Not on file  Social History Narrative   Not on file   Social Determinants of Health   Financial Resource Strain: Not on file  Food Insecurity: Not on file  Transportation Needs: Not on file  Physical Activity: Not on file  Stress: Not on file  Social Connections: Not on file  Intimate Partner Violence: Not on file    FAMILY HISTORY: Family History  Problem Relation Age of Onset   Cancer Neg Hx     ALLERGIES:  is allergic to tetracycline hcl, tetracyclines & related, ciprofloxacin, sulfamethoxazole-trimethoprim, and vancomycin.  MEDICATIONS:  Current Outpatient Medications  Medication Sig Dispense Refill   acetaminophen (TYLENOL) 500 MG tablet Take 500 mg by mouth every 6 (six) hours as needed for moderate pain.     ketorolac (TORADOL) 10 MG tablet Take 10 mg by mouth every 8 (eight) hours.     No current facility-administered medications for this visit.    REVIEW OF SYSTEMS:   Constitutional: ( - ) fevers, ( - )  chills , ( - ) night sweats Eyes: ( - ) blurriness of vision, ( - ) double vision, ( - ) watery eyes Ears, nose, mouth, throat, and face: ( - ) mucositis, ( - ) sore throat Respiratory: ( - ) cough, ( - ) dyspnea, ( - ) wheezes Cardiovascular: ( - ) palpitation, ( - ) chest discomfort, ( - ) lower extremity swelling Gastrointestinal:  ( - ) nausea, ( - ) heartburn, ( - ) change in bowel habits Skin: ( - ) abnormal skin rashes Lymphatics: ( - ) new lymphadenopathy, ( - ) easy bruising Neurological:  ( - ) numbness, ( - ) tingling, ( - ) new weaknesses Behavioral/Psych: ( - ) mood change, ( - ) new changes  All other systems were reviewed with the patient and are negative.  PHYSICAL EXAMINATION:  Vitals:   10/24/23 1410  BP: 124/84  Pulse: 67  Resp: 13  Temp: 98.2 F (36.8 C)  SpO2: 98%   Filed Weights   10/24/23 1410  Weight: 189 lb 8 oz (86 kg)    GENERAL: well appearing middle-aged Caucasian male in NAD  SKIN: skin color, texture, turgor are normal, no rashes or significant lesions EYES: conjunctiva are pink and non-injected, sclera clear LUNGS: clear to auscultation and percussion with normal breathing effort HEART: regular rate & rhythm and no murmurs and no lower extremity edema Musculoskeletal: no cyanosis of digits and no clubbing  PSYCH: alert & oriented x 3,  fluent speech NEURO: no focal motor/sensory deficits  LABORATORY DATA:  I have reviewed the data as listed    Latest Ref Rng & Units 10/20/2023    1:57 PM 10/10/2018    2:58 PM 01/28/2018    9:38 PM  CBC  WBC 4.0 - 10.5 K/uL 5.7  6.0  9.1   Hemoglobin 13.0 - 17.0 g/dL 13.0  86.5  78.4   Hematocrit 39.0 - 52.0 % 47.5  46.3  47.4   Platelets 150 - 400 K/uL 216  234  248        Latest Ref Rng & Units 10/20/2023    1:57 PM 10/10/2018    2:58 PM 01/28/2018    9:38 PM  CMP  Glucose 70 - 99 mg/dL 696  295  284   BUN 6 - 20 mg/dL 19  24  17    Creatinine 0.61 - 1.24 mg/dL 1.32  4.40  1.02   Sodium 135 - 145 mmol/L 140  140  141   Potassium 3.5 - 5.1 mmol/L 4.3  4.0  3.4   Chloride 98 - 111 mmol/L 105  107  107   CO2 22 - 32 mmol/L 31  24  23    Calcium 8.9 - 10.3 mg/dL 9.6  9.3  9.4   Total Protein 6.5 - 8.1 g/dL 8.0  8.1    Total Bilirubin <1.2 mg/dL 0.6  1.4    Alkaline Phos 38 - 126 U/L 90  80    AST 15 - 41 U/L 22  19    ALT 0 - 44 U/L 30  23       ASSESSMENT & PLAN Manuel Gomez 52 y.o. male with medical history significant for GERD, anxiety, and hx of testicular cancer who presents for  evaluation of abnormal serum free light chain ratio.   After review of the labs, review of the records, and discussion with the patient the patients findings are most consistent with elevated serum free light chain ratio in the setting of inflammation.  No evidence of a monoclonal component, low suspicion for monoclonal gammopathy.  # Elevated Serum Free Light Chain Ratio --today will order an SPEP, UPEP, SFLC and beta 2 microglobulin --additionally will collect new baseline CBC, CMP, and LDH --recommend a metastatic bone survey to assess for lytic lesions if an M protein is detected.  --will consider the need for a bone marrow biopsy pending the above results  --Serum free light chain ratio can be elevated in the setting of kidney dysfunction.  It does not necessarily represent a monoclonal dermopathy.  Without an M protein component there is no need for routine follow-up in our clinic. --RTC pending above lab results.   No orders of the defined types were placed in this encounter.   All questions were answered. The patient knows to call the clinic with any problems, questions or concerns.  A total of more than 60 minutes were spent on this encounter with face-to-face time and non-face-to-face time, including preparing to see the patient, ordering tests and/or medications, counseling the patient and coordination of care as outlined above.   Ulysees Barns, MD Department of Hematology/Oncology [facility] Phone: [phone] Pager: [phone] Email: [email or web address]  10/24/2023 4:47 PM

## 2023-10-31 DIAGNOSIS — R768 Other specified abnormal immunological findings in serum: Secondary | ICD-10-CM | POA: Diagnosis not present

## 2023-11-08 LAB — UPEP/UIFE/LIGHT CHAINS/TP, 24-HR UR
% BETA, Urine: 0 %
ALPHA 1 URINE: 0 %
Albumin, U: 0 %
Alpha 2, Urine: 0 %
Free Kappa Lt Chains,Ur: 2.91 mg/L (ref 1.17–86.46)
Free Kappa/Lambda Ratio: >4.22 (ref 1.83–14.26)
Free Lambda Lt Chains,Ur: <0.69 mg/L (ref 0.27–15.21)
GAMMA GLOBULIN URINE: 0 %
Total Protein, Urine-Ur/day: <124 mg/(24.h) (ref 30–150)
Total Protein, Urine: <4 mg/dL

## 2023-11-15 ENCOUNTER — Telehealth: Payer: Self-pay | Admitting: *Deleted

## 2023-11-15 NOTE — Telephone Encounter (Signed)
Attempted to contact patient with results per Dr. 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 There is no need for further routine workup or follow up in our clinic."  No answer. LVM to contact office tomorrow for results

## 2023-11-16 NOTE — Telephone Encounter (Signed)
Received call back from pt. Reviewed his recent lab results with him. Per Dr. 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 There is no need for further routine workup or follow up in our clinic. Pt voiced understanding.

## 2024-12-02 ENCOUNTER — Other Ambulatory Visit: Payer: Self-pay

## 2024-12-02 ENCOUNTER — Emergency Department (HOSPITAL_BASED_OUTPATIENT_CLINIC_OR_DEPARTMENT_OTHER)
Admission: EM | Admit: 2024-12-02 | Discharge: 2024-12-02 | Disposition: A | Discharge status: Home / Self Care | Attending: Emergency Medicine | Admitting: Emergency Medicine

## 2024-12-02 DIAGNOSIS — Z8547 Personal history of malignant neoplasm of testis: Secondary | ICD-10-CM | POA: Diagnosis not present

## 2024-12-02 DIAGNOSIS — H5711 Ocular pain, right eye: Secondary | ICD-10-CM | POA: Insufficient documentation

## 2024-12-02 DIAGNOSIS — Z79899 Other long term (current) drug therapy: Secondary | ICD-10-CM | POA: Diagnosis not present

## 2024-12-02 MED ORDER — TETRACAINE HCL 0.5 % OP SOLN
1.0000 [drp] | Freq: Once | OPHTHALMIC | Status: AC
  Administered 2024-12-02: 1 [drp] via OPHTHALMIC
  Filled 2024-12-02: qty 4

## 2024-12-02 NOTE — ED Triage Notes (Addendum)
 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 Did not know cause.   Tonight comes to the ER for pain around orbit. Denies vision changes, no reddening of sclera or mucous membranes surrounding, no pain in eye. Pain no worse with palpation around eye. Denies pain in jaw, denies headache. Dr. Oneita is eye Dr. Using OTC redness eye drops at home.

## 2024-12-02 NOTE — ED Provider Notes (Signed)
 " [place] EMERGENCY DEPARTMENT AT [facility] Provider Note   CSN: [id number] Arrival date & time: 12/02/24  9945     Patient presents with: Facial Pain   Manuel Gomez is a 53 y.o. male.   HPI     This is a 53 year old male who presents with right eye pain.  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  He is legally blind in the left eye.  He reports pain about the right orbit.  He states that when he was previously diagnosed several years ago with uveitis this is how his symptoms started.  He has not had any vision changes or significant photosensitivity although he states that while he was in the waiting room he noted that the lights did bother him somewhat.  He called his eye doctor to request steroid drops but did not hear back from him.  He has not had any injury.  He is using drops for redness.  No recent illnesses.  Prior to Admission medications  Medication Sig Start Date End Date Taking? Authorizing Provider  acetaminophen  (TYLENOL ) 500 MG tablet Take 500 mg by mouth every 6 (six) hours as needed for moderate pain.    [provider]  ketorolac  (TORADOL ) 10 MG tablet Take 10 mg by mouth every 8 (eight) hours.    [provider]    Allergies: Tetracycline hcl, Tetracyclines & related, Ciprofloxacin, Sulfamethoxazole-trimethoprim, and Vancomycin    Review of Systems  Constitutional:  Negative for fever.  Eyes:  Positive for photophobia and pain. Negative for discharge, redness, itching and visual disturbance.  All other systems reviewed and are negative.   Updated Vital Signs BP (!) 135/91 (BP Location: Right Arm)   Pulse 67   Temp 98.1 F (36.7 C)   Resp 18   SpO2 98%   Physical Exam Vitals and nursing note reviewed.  Constitutional:      Appearance: He is well-developed. He is not ill-appearing.  HENT:     Head: Normocephalic and atraumatic.     Nose: Nose normal.     Mouth/Throat:     Mouth: Mucous membranes are moist.  Eyes:      Pupils: Pupils are equal, round, and reactive to light.     Comments: Pupils equal round and reactive to light, disconjugate gaze noted with left eye deviation laterally, no scleral redness, pressure right eye 24, pressure left eye 18, no redness about the eyelid, extraocular movements intact without pain  Cardiovascular:     Rate and Rhythm: Normal rate and regular rhythm.  Pulmonary:     Effort: Pulmonary effort is normal. No respiratory distress.  Abdominal:     Palpations: Abdomen is soft.  Musculoskeletal:     Cervical back: Neck supple.  Lymphadenopathy:     Cervical: No cervical adenopathy.  Skin:    General: Skin is warm and dry.  Neurological:     Mental Status: He is alert and oriented to person, place, and time.  Psychiatric:        Mood and Affect: Mood normal.     (all labs ordered are listed, but only abnormal results are displayed) Labs Reviewed - No data to display  EKG: None  Radiology: No results found.   Procedures   Medications Ordered in the ED  tetracaine  (PONTOCAINE) 0.5 % ophthalmic solution 1 drop (has no administration in time range)  Medical Decision Making Risk Prescription drug management.   This patient presents to the ED for concern of eye pain, this involves an extensive number of treatment options, and is a complaint that carries with it a high risk of complications and morbidity.  I considered the following differential and admission for this acute, potentially life threatening condition.  The differential diagnosis includes enthesitis, uveitis, foreign body abrasion  MDM:    This is a 53 year old male who presents with atraumatic eye pain.  He is nontoxic and vital signs are reassuring.  Objectively, no eye redness and pupils are equal round and reactive.  Pressures are reassuring.  At baseline he is legally blind in the left eye with known disconjugate gaze.  At this time I do not have objective  evidence of uveitis although patient states that this is how his uveitis started during his last presentation.  Given absence of objective findings, would be hesitant to start steroid drops at this time.  Recommend that he follow-up closely with his eye doctor.  In the meantime if he has any new or worsening symptoms recommend return for reevaluation.  Patient is agreeable to this plan.  (Labs, imaging, consults)  Labs: I Ordered, and personally interpreted labs.  The pertinent results include: N/A  Imaging Studies ordered: I ordered imaging studies including N/A I independently visualized and interpreted imaging. I agree with the radiologist interpretation  Additional history obtained from chart review.  External records from outside source obtained and reviewed including prior evaluations  Cardiac Monitoring: The patient was not maintained on a cardiac monitor.  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;  seminoma w/ leydig cell hyperplasia, Stage pT1   Primary hypogonadism in male 2016   post op left testis removal   Tinnitus of both ears    Wears glasses      Medicines Meds ordered this encounter  Medications   tetracaine  (PONTOCAINE) 0.5 % ophthalmic solution 1 drop    I have reviewed the patients home medicines and have made adjustments as needed  Problem List / ED Course: Problem List Items Addressed This Visit    None Visit Diagnoses       Eye pain, right    -  Primary                Final diagnoses:  Eye pain, right    ED Discharge Orders     None          Bari Charmaine FALCON, MD 12/02/24 [phone]  "

## 2024-12-02 NOTE — Discharge Instructions (Signed)
 You were seen today with concerns for eye pain.  Given your history of uveitis, this was your concern.  At this time there is no obvious evidence of uveitis although would recommend that you have formal eye evaluation by your eye doctor.  If in the next 24 hours you develop worsening pain, photosensitivity, redness, you need to be reevaluated.  Follow-up closely with your eye doctor.  If you are unable to get in with them you were provided with contact information for our ophthalmologist on-call.
